# Patient Record
Sex: Male | Born: 1940
Health system: Southern US, Community
[De-identification: ages and names within clinical notes are randomized; demographics above are authoritative.]

## PROBLEM LIST (undated history)

## (undated) DIAGNOSIS — Z8719 Personal history of other diseases of the digestive system: Secondary | ICD-10-CM

## (undated) DIAGNOSIS — K51511 Left sided colitis with rectal bleeding: Secondary | ICD-10-CM

## (undated) DIAGNOSIS — K219 Gastro-esophageal reflux disease without esophagitis: Secondary | ICD-10-CM

## (undated) DIAGNOSIS — E039 Hypothyroidism, unspecified: Secondary | ICD-10-CM

## (undated) DIAGNOSIS — G5600 Carpal tunnel syndrome, unspecified upper limb: Secondary | ICD-10-CM

## (undated) DIAGNOSIS — K284 Chronic or unspecified gastrojejunal ulcer with hemorrhage: Secondary | ICD-10-CM

## (undated) HISTORY — PX: ROTATOR CUFF REPAIR: SHX139

## (undated) HISTORY — DX: Chronic or unspecified gastrojejunal ulcer with hemorrhage: K28.4

## (undated) HISTORY — DX: Gastro-esophageal reflux disease without esophagitis: K21.9

## (undated) HISTORY — PX: FRACTURE SURGERY: SHX138

## (undated) HISTORY — DX: Personal history of other diseases of the digestive system: Z87.19

## (undated) HISTORY — PX: THYROIDECTOMY: SHX17

## (undated) HISTORY — PX: ESOPHAGOGASTRODUODENOSCOPY: SHX1529

## (undated) HISTORY — PX: MOUTH SURGERY: SHX715

## (undated) HISTORY — DX: Left sided colitis with rectal bleeding: K51.511

## (undated) HISTORY — PX: CHOLECYSTECTOMY: SHX55

## (undated) HISTORY — PX: TONSILLECTOMY: SUR1361

## (undated) HISTORY — DX: Hypothyroidism, unspecified: E03.9

## (undated) HISTORY — PX: LOW ANTERIOR BOWEL RESECTION: SUR1240

---

## 1995-07-17 DIAGNOSIS — C73 Malignant neoplasm of thyroid gland: Secondary | ICD-10-CM

## 1995-07-17 HISTORY — DX: Malignant neoplasm of thyroid gland: C73

## 1996-01-26 DIAGNOSIS — C73 Malignant neoplasm of thyroid gland: Secondary | ICD-10-CM | POA: Insufficient documentation

## 1996-04-27 DIAGNOSIS — C73 Malignant neoplasm of thyroid gland: Secondary | ICD-10-CM | POA: Insufficient documentation

## 2003-04-22 ENCOUNTER — Ambulatory Visit (HOSPITAL_BASED_OUTPATIENT_CLINIC_OR_DEPARTMENT_OTHER): Admission: RE | Admit: 2003-04-22 | Discharge: 2003-04-22 | Payer: Self-pay | Admitting: Orthopedic Surgery

## 2003-04-22 ENCOUNTER — Ambulatory Visit (HOSPITAL_COMMUNITY): Admission: RE | Admit: 2003-04-22 | Discharge: 2003-04-22 | Payer: Self-pay | Admitting: Orthopedic Surgery

## 2006-08-30 ENCOUNTER — Ambulatory Visit (HOSPITAL_BASED_OUTPATIENT_CLINIC_OR_DEPARTMENT_OTHER): Admission: RE | Admit: 2006-08-30 | Discharge: 2006-08-30 | Payer: Self-pay | Admitting: Orthopedic Surgery

## 2006-11-28 ENCOUNTER — Ambulatory Visit (HOSPITAL_BASED_OUTPATIENT_CLINIC_OR_DEPARTMENT_OTHER): Admission: RE | Admit: 2006-11-28 | Discharge: 2006-11-28 | Payer: Self-pay | Admitting: Orthopedic Surgery

## 2011-12-25 DIAGNOSIS — L821 Other seborrheic keratosis: Secondary | ICD-10-CM | POA: Diagnosis not present

## 2012-01-23 DIAGNOSIS — H43399 Other vitreous opacities, unspecified eye: Secondary | ICD-10-CM | POA: Diagnosis not present

## 2012-01-28 DIAGNOSIS — Z79899 Other long term (current) drug therapy: Secondary | ICD-10-CM | POA: Diagnosis not present

## 2012-01-28 DIAGNOSIS — C73 Malignant neoplasm of thyroid gland: Secondary | ICD-10-CM | POA: Diagnosis not present

## 2012-03-06 DIAGNOSIS — F329 Major depressive disorder, single episode, unspecified: Secondary | ICD-10-CM | POA: Diagnosis not present

## 2012-03-06 DIAGNOSIS — J301 Allergic rhinitis due to pollen: Secondary | ICD-10-CM | POA: Diagnosis not present

## 2012-03-06 DIAGNOSIS — E785 Hyperlipidemia, unspecified: Secondary | ICD-10-CM | POA: Diagnosis not present

## 2012-03-06 DIAGNOSIS — Z125 Encounter for screening for malignant neoplasm of prostate: Secondary | ICD-10-CM | POA: Diagnosis not present

## 2012-04-21 DIAGNOSIS — R7401 Elevation of levels of liver transaminase levels: Secondary | ICD-10-CM | POA: Diagnosis not present

## 2012-04-21 DIAGNOSIS — R7402 Elevation of levels of lactic acid dehydrogenase (LDH): Secondary | ICD-10-CM | POA: Diagnosis not present

## 2012-04-21 DIAGNOSIS — E785 Hyperlipidemia, unspecified: Secondary | ICD-10-CM | POA: Diagnosis not present

## 2012-04-21 DIAGNOSIS — Z23 Encounter for immunization: Secondary | ICD-10-CM | POA: Diagnosis not present

## 2012-04-21 DIAGNOSIS — Z6828 Body mass index (BMI) 28.0-28.9, adult: Secondary | ICD-10-CM | POA: Diagnosis not present

## 2012-05-02 DIAGNOSIS — R7989 Other specified abnormal findings of blood chemistry: Secondary | ICD-10-CM | POA: Diagnosis not present

## 2012-06-24 DIAGNOSIS — R040 Epistaxis: Secondary | ICD-10-CM | POA: Diagnosis not present

## 2012-08-19 DIAGNOSIS — H43399 Other vitreous opacities, unspecified eye: Secondary | ICD-10-CM | POA: Diagnosis not present

## 2012-09-22 DIAGNOSIS — R1032 Left lower quadrant pain: Secondary | ICD-10-CM | POA: Diagnosis not present

## 2012-09-22 DIAGNOSIS — K922 Gastrointestinal hemorrhage, unspecified: Secondary | ICD-10-CM | POA: Diagnosis not present

## 2012-09-22 DIAGNOSIS — K589 Irritable bowel syndrome without diarrhea: Secondary | ICD-10-CM | POA: Diagnosis not present

## 2012-09-22 DIAGNOSIS — R197 Diarrhea, unspecified: Secondary | ICD-10-CM | POA: Diagnosis not present

## 2012-09-26 DIAGNOSIS — K200 Eosinophilic esophagitis: Secondary | ICD-10-CM | POA: Diagnosis not present

## 2012-09-26 DIAGNOSIS — R1314 Dysphagia, pharyngoesophageal phase: Secondary | ICD-10-CM | POA: Diagnosis not present

## 2012-09-26 DIAGNOSIS — K228 Other specified diseases of esophagus: Secondary | ICD-10-CM | POA: Diagnosis not present

## 2012-09-26 DIAGNOSIS — K921 Melena: Secondary | ICD-10-CM | POA: Diagnosis not present

## 2012-09-26 DIAGNOSIS — K5289 Other specified noninfective gastroenteritis and colitis: Secondary | ICD-10-CM | POA: Diagnosis not present

## 2012-09-26 DIAGNOSIS — K633 Ulcer of intestine: Secondary | ICD-10-CM | POA: Diagnosis not present

## 2012-09-26 DIAGNOSIS — R197 Diarrhea, unspecified: Secondary | ICD-10-CM | POA: Diagnosis not present

## 2012-09-26 DIAGNOSIS — K222 Esophageal obstruction: Secondary | ICD-10-CM | POA: Diagnosis not present

## 2012-09-30 DIAGNOSIS — K5130 Ulcerative (chronic) rectosigmoiditis without complications: Secondary | ICD-10-CM | POA: Diagnosis not present

## 2012-09-30 DIAGNOSIS — K219 Gastro-esophageal reflux disease without esophagitis: Secondary | ICD-10-CM | POA: Diagnosis not present

## 2012-10-01 DIAGNOSIS — R197 Diarrhea, unspecified: Secondary | ICD-10-CM | POA: Diagnosis not present

## 2012-10-14 DIAGNOSIS — K5100 Ulcerative (chronic) pancolitis without complications: Secondary | ICD-10-CM | POA: Diagnosis not present

## 2012-11-20 DIAGNOSIS — K5130 Ulcerative (chronic) rectosigmoiditis without complications: Secondary | ICD-10-CM | POA: Diagnosis not present

## 2012-12-23 DIAGNOSIS — K5100 Ulcerative (chronic) pancolitis without complications: Secondary | ICD-10-CM | POA: Diagnosis not present

## 2012-12-23 DIAGNOSIS — K5190 Ulcerative colitis, unspecified, without complications: Secondary | ICD-10-CM | POA: Diagnosis not present

## 2013-01-26 DIAGNOSIS — C73 Malignant neoplasm of thyroid gland: Secondary | ICD-10-CM | POA: Diagnosis not present

## 2013-01-26 DIAGNOSIS — Z85850 Personal history of malignant neoplasm of thyroid: Secondary | ICD-10-CM | POA: Diagnosis not present

## 2013-01-26 DIAGNOSIS — Z09 Encounter for follow-up examination after completed treatment for conditions other than malignant neoplasm: Secondary | ICD-10-CM | POA: Diagnosis not present

## 2013-01-26 DIAGNOSIS — Z79899 Other long term (current) drug therapy: Secondary | ICD-10-CM | POA: Diagnosis not present

## 2013-01-26 DIAGNOSIS — E039 Hypothyroidism, unspecified: Secondary | ICD-10-CM | POA: Diagnosis not present

## 2013-04-22 DIAGNOSIS — E881 Lipodystrophy, not elsewhere classified: Secondary | ICD-10-CM | POA: Diagnosis not present

## 2013-04-22 DIAGNOSIS — K5289 Other specified noninfective gastroenteritis and colitis: Secondary | ICD-10-CM | POA: Diagnosis not present

## 2013-04-22 DIAGNOSIS — G4700 Insomnia, unspecified: Secondary | ICD-10-CM | POA: Diagnosis not present

## 2013-04-22 DIAGNOSIS — Z0000 Encounter for general adult medical examination without abnormal findings: Secondary | ICD-10-CM | POA: Diagnosis not present

## 2013-04-22 DIAGNOSIS — C73 Malignant neoplasm of thyroid gland: Secondary | ICD-10-CM | POA: Diagnosis not present

## 2013-04-22 DIAGNOSIS — Z125 Encounter for screening for malignant neoplasm of prostate: Secondary | ICD-10-CM | POA: Diagnosis not present

## 2013-04-22 DIAGNOSIS — K5229 Other allergic and dietetic gastroenteritis and colitis: Secondary | ICD-10-CM | POA: Diagnosis not present

## 2013-08-13 DIAGNOSIS — K5130 Ulcerative (chronic) rectosigmoiditis without complications: Secondary | ICD-10-CM | POA: Diagnosis not present

## 2013-08-13 DIAGNOSIS — K219 Gastro-esophageal reflux disease without esophagitis: Secondary | ICD-10-CM | POA: Diagnosis not present

## 2013-08-21 DIAGNOSIS — H43399 Other vitreous opacities, unspecified eye: Secondary | ICD-10-CM | POA: Diagnosis not present

## 2014-02-24 DIAGNOSIS — Z85850 Personal history of malignant neoplasm of thyroid: Secondary | ICD-10-CM | POA: Diagnosis not present

## 2014-02-24 DIAGNOSIS — D449 Neoplasm of uncertain behavior of unspecified endocrine gland: Secondary | ICD-10-CM | POA: Diagnosis not present

## 2014-02-24 DIAGNOSIS — D649 Anemia, unspecified: Secondary | ICD-10-CM | POA: Diagnosis not present

## 2014-03-15 DIAGNOSIS — N63 Unspecified lump in unspecified breast: Secondary | ICD-10-CM | POA: Diagnosis not present

## 2014-03-15 DIAGNOSIS — C73 Malignant neoplasm of thyroid gland: Secondary | ICD-10-CM | POA: Diagnosis not present

## 2014-03-15 DIAGNOSIS — Z6829 Body mass index (BMI) 29.0-29.9, adult: Secondary | ICD-10-CM | POA: Diagnosis not present

## 2014-03-15 DIAGNOSIS — E038 Other specified hypothyroidism: Secondary | ICD-10-CM | POA: Diagnosis not present

## 2014-03-18 DIAGNOSIS — N63 Unspecified lump in unspecified breast: Secondary | ICD-10-CM | POA: Diagnosis not present

## 2014-03-18 DIAGNOSIS — N62 Hypertrophy of breast: Secondary | ICD-10-CM | POA: Diagnosis not present

## 2014-04-23 DIAGNOSIS — Z125 Encounter for screening for malignant neoplasm of prostate: Secondary | ICD-10-CM | POA: Diagnosis not present

## 2014-04-23 DIAGNOSIS — N63 Unspecified lump in breast: Secondary | ICD-10-CM | POA: Diagnosis not present

## 2014-04-23 DIAGNOSIS — E789 Disorder of lipoprotein metabolism, unspecified: Secondary | ICD-10-CM | POA: Diagnosis not present

## 2014-04-23 DIAGNOSIS — Z6829 Body mass index (BMI) 29.0-29.9, adult: Secondary | ICD-10-CM | POA: Diagnosis not present

## 2014-04-23 DIAGNOSIS — Z23 Encounter for immunization: Secondary | ICD-10-CM | POA: Diagnosis not present

## 2014-04-23 DIAGNOSIS — G4700 Insomnia, unspecified: Secondary | ICD-10-CM | POA: Diagnosis not present

## 2014-04-23 DIAGNOSIS — Z0289 Encounter for other administrative examinations: Secondary | ICD-10-CM | POA: Diagnosis not present

## 2014-04-23 DIAGNOSIS — Z0000 Encounter for general adult medical examination without abnormal findings: Secondary | ICD-10-CM | POA: Diagnosis not present

## 2014-04-23 DIAGNOSIS — K529 Noninfective gastroenteritis and colitis, unspecified: Secondary | ICD-10-CM | POA: Diagnosis not present

## 2014-04-23 DIAGNOSIS — E785 Hyperlipidemia, unspecified: Secondary | ICD-10-CM | POA: Diagnosis not present

## 2014-04-23 DIAGNOSIS — J301 Allergic rhinitis due to pollen: Secondary | ICD-10-CM | POA: Diagnosis not present

## 2014-05-05 DIAGNOSIS — Z85850 Personal history of malignant neoplasm of thyroid: Secondary | ICD-10-CM | POA: Diagnosis not present

## 2014-05-05 DIAGNOSIS — E890 Postprocedural hypothyroidism: Secondary | ICD-10-CM | POA: Diagnosis not present

## 2014-06-16 DIAGNOSIS — E890 Postprocedural hypothyroidism: Secondary | ICD-10-CM | POA: Diagnosis not present

## 2014-06-16 DIAGNOSIS — Z85850 Personal history of malignant neoplasm of thyroid: Secondary | ICD-10-CM | POA: Diagnosis not present

## 2014-08-03 DIAGNOSIS — K219 Gastro-esophageal reflux disease without esophagitis: Secondary | ICD-10-CM | POA: Diagnosis not present

## 2014-08-03 DIAGNOSIS — K5130 Ulcerative (chronic) rectosigmoiditis without complications: Secondary | ICD-10-CM | POA: Diagnosis not present

## 2014-08-27 DIAGNOSIS — K625 Hemorrhage of anus and rectum: Secondary | ICD-10-CM | POA: Diagnosis not present

## 2014-08-27 DIAGNOSIS — Z8719 Personal history of other diseases of the digestive system: Secondary | ICD-10-CM | POA: Diagnosis not present

## 2014-08-27 DIAGNOSIS — R197 Diarrhea, unspecified: Secondary | ICD-10-CM | POA: Diagnosis not present

## 2014-08-27 DIAGNOSIS — K5100 Ulcerative (chronic) pancolitis without complications: Secondary | ICD-10-CM | POA: Diagnosis not present

## 2014-08-31 DIAGNOSIS — A09 Infectious gastroenteritis and colitis, unspecified: Secondary | ICD-10-CM | POA: Diagnosis not present

## 2014-09-06 DIAGNOSIS — K529 Noninfective gastroenteritis and colitis, unspecified: Secondary | ICD-10-CM | POA: Diagnosis not present

## 2014-09-06 DIAGNOSIS — Z87891 Personal history of nicotine dependence: Secondary | ICD-10-CM | POA: Diagnosis not present

## 2014-09-06 DIAGNOSIS — R1310 Dysphagia, unspecified: Secondary | ICD-10-CM | POA: Diagnosis not present

## 2014-09-06 DIAGNOSIS — Z980 Intestinal bypass and anastomosis status: Secondary | ICD-10-CM | POA: Diagnosis not present

## 2014-09-06 DIAGNOSIS — Z8719 Personal history of other diseases of the digestive system: Secondary | ICD-10-CM | POA: Diagnosis not present

## 2014-09-06 DIAGNOSIS — K580 Irritable bowel syndrome with diarrhea: Secondary | ICD-10-CM | POA: Diagnosis not present

## 2014-09-06 DIAGNOSIS — E890 Postprocedural hypothyroidism: Secondary | ICD-10-CM | POA: Diagnosis not present

## 2014-09-06 DIAGNOSIS — K625 Hemorrhage of anus and rectum: Secondary | ICD-10-CM | POA: Diagnosis not present

## 2014-09-06 DIAGNOSIS — K5730 Diverticulosis of large intestine without perforation or abscess without bleeding: Secondary | ICD-10-CM | POA: Diagnosis not present

## 2014-09-06 DIAGNOSIS — R197 Diarrhea, unspecified: Secondary | ICD-10-CM | POA: Diagnosis not present

## 2014-09-06 DIAGNOSIS — K5190 Ulcerative colitis, unspecified, without complications: Secondary | ICD-10-CM | POA: Diagnosis not present

## 2014-09-06 DIAGNOSIS — Z85850 Personal history of malignant neoplasm of thyroid: Secondary | ICD-10-CM | POA: Diagnosis not present

## 2014-09-06 DIAGNOSIS — K51511 Left sided colitis with rectal bleeding: Secondary | ICD-10-CM | POA: Diagnosis not present

## 2014-09-06 DIAGNOSIS — K648 Other hemorrhoids: Secondary | ICD-10-CM | POA: Diagnosis not present

## 2014-09-06 HISTORY — PX: COLONOSCOPY: SHX174

## 2014-09-10 DIAGNOSIS — H43813 Vitreous degeneration, bilateral: Secondary | ICD-10-CM | POA: Diagnosis not present

## 2014-10-08 DIAGNOSIS — K51511 Left sided colitis with rectal bleeding: Secondary | ICD-10-CM | POA: Diagnosis not present

## 2014-10-08 DIAGNOSIS — K219 Gastro-esophageal reflux disease without esophagitis: Secondary | ICD-10-CM | POA: Diagnosis not present

## 2014-10-15 DIAGNOSIS — E890 Postprocedural hypothyroidism: Secondary | ICD-10-CM | POA: Diagnosis not present

## 2014-10-15 DIAGNOSIS — Z85850 Personal history of malignant neoplasm of thyroid: Secondary | ICD-10-CM | POA: Diagnosis not present

## 2014-11-15 DIAGNOSIS — Z85850 Personal history of malignant neoplasm of thyroid: Secondary | ICD-10-CM | POA: Diagnosis not present

## 2014-12-20 DIAGNOSIS — K5100 Ulcerative (chronic) pancolitis without complications: Secondary | ICD-10-CM | POA: Diagnosis not present

## 2014-12-20 DIAGNOSIS — J439 Emphysema, unspecified: Secondary | ICD-10-CM | POA: Diagnosis not present

## 2014-12-20 DIAGNOSIS — K625 Hemorrhage of anus and rectum: Secondary | ICD-10-CM | POA: Diagnosis not present

## 2014-12-20 DIAGNOSIS — R1032 Left lower quadrant pain: Secondary | ICD-10-CM | POA: Diagnosis not present

## 2014-12-21 DIAGNOSIS — L820 Inflamed seborrheic keratosis: Secondary | ICD-10-CM | POA: Diagnosis not present

## 2015-01-04 DIAGNOSIS — Z1159 Encounter for screening for other viral diseases: Secondary | ICD-10-CM | POA: Diagnosis not present

## 2015-01-04 DIAGNOSIS — Z111 Encounter for screening for respiratory tuberculosis: Secondary | ICD-10-CM | POA: Diagnosis not present

## 2015-01-25 DIAGNOSIS — Z85850 Personal history of malignant neoplasm of thyroid: Secondary | ICD-10-CM | POA: Diagnosis not present

## 2015-01-28 DIAGNOSIS — K5100 Ulcerative (chronic) pancolitis without complications: Secondary | ICD-10-CM | POA: Diagnosis not present

## 2015-02-02 DIAGNOSIS — Z85850 Personal history of malignant neoplasm of thyroid: Secondary | ICD-10-CM | POA: Diagnosis not present

## 2015-02-02 DIAGNOSIS — Z9089 Acquired absence of other organs: Secondary | ICD-10-CM | POA: Diagnosis not present

## 2015-04-27 DIAGNOSIS — Z23 Encounter for immunization: Secondary | ICD-10-CM | POA: Diagnosis not present

## 2015-06-21 DIAGNOSIS — Z6830 Body mass index (BMI) 30.0-30.9, adult: Secondary | ICD-10-CM | POA: Diagnosis not present

## 2015-06-21 DIAGNOSIS — R030 Elevated blood-pressure reading, without diagnosis of hypertension: Secondary | ICD-10-CM | POA: Diagnosis not present

## 2015-06-21 DIAGNOSIS — J208 Acute bronchitis due to other specified organisms: Secondary | ICD-10-CM | POA: Diagnosis not present

## 2015-06-21 DIAGNOSIS — J301 Allergic rhinitis due to pollen: Secondary | ICD-10-CM | POA: Diagnosis not present

## 2015-06-21 DIAGNOSIS — K529 Noninfective gastroenteritis and colitis, unspecified: Secondary | ICD-10-CM | POA: Diagnosis not present

## 2015-07-28 DIAGNOSIS — K51511 Left sided colitis with rectal bleeding: Secondary | ICD-10-CM | POA: Diagnosis not present

## 2015-09-01 DIAGNOSIS — E663 Overweight: Secondary | ICD-10-CM | POA: Diagnosis not present

## 2015-09-01 DIAGNOSIS — K529 Noninfective gastroenteritis and colitis, unspecified: Secondary | ICD-10-CM | POA: Diagnosis not present

## 2015-09-01 DIAGNOSIS — J301 Allergic rhinitis due to pollen: Secondary | ICD-10-CM | POA: Diagnosis not present

## 2015-09-01 DIAGNOSIS — J019 Acute sinusitis, unspecified: Secondary | ICD-10-CM | POA: Diagnosis not present

## 2015-09-01 DIAGNOSIS — Z6829 Body mass index (BMI) 29.0-29.9, adult: Secondary | ICD-10-CM | POA: Diagnosis not present

## 2015-09-01 DIAGNOSIS — E038 Other specified hypothyroidism: Secondary | ICD-10-CM | POA: Diagnosis not present

## 2015-09-01 DIAGNOSIS — C73 Malignant neoplasm of thyroid gland: Secondary | ICD-10-CM | POA: Diagnosis not present

## 2015-09-01 DIAGNOSIS — I10 Essential (primary) hypertension: Secondary | ICD-10-CM | POA: Diagnosis not present

## 2015-09-01 DIAGNOSIS — E785 Hyperlipidemia, unspecified: Secondary | ICD-10-CM | POA: Diagnosis not present

## 2015-09-01 DIAGNOSIS — B349 Viral infection, unspecified: Secondary | ICD-10-CM | POA: Diagnosis not present

## 2015-09-01 DIAGNOSIS — J9801 Acute bronchospasm: Secondary | ICD-10-CM | POA: Diagnosis not present

## 2015-09-14 DIAGNOSIS — H43813 Vitreous degeneration, bilateral: Secondary | ICD-10-CM | POA: Diagnosis not present

## 2015-09-20 DIAGNOSIS — K529 Noninfective gastroenteritis and colitis, unspecified: Secondary | ICD-10-CM | POA: Diagnosis not present

## 2015-09-20 DIAGNOSIS — E038 Other specified hypothyroidism: Secondary | ICD-10-CM | POA: Diagnosis not present

## 2015-09-20 DIAGNOSIS — I10 Essential (primary) hypertension: Secondary | ICD-10-CM | POA: Diagnosis not present

## 2015-09-20 DIAGNOSIS — C73 Malignant neoplasm of thyroid gland: Secondary | ICD-10-CM | POA: Diagnosis not present

## 2016-01-25 DIAGNOSIS — Z85850 Personal history of malignant neoplasm of thyroid: Secondary | ICD-10-CM | POA: Diagnosis not present

## 2016-01-25 DIAGNOSIS — D649 Anemia, unspecified: Secondary | ICD-10-CM | POA: Diagnosis not present

## 2016-01-25 DIAGNOSIS — E538 Deficiency of other specified B group vitamins: Secondary | ICD-10-CM | POA: Diagnosis not present

## 2016-01-31 DIAGNOSIS — K51511 Left sided colitis with rectal bleeding: Secondary | ICD-10-CM | POA: Diagnosis not present

## 2016-03-27 DIAGNOSIS — Z0000 Encounter for general adult medical examination without abnormal findings: Secondary | ICD-10-CM | POA: Diagnosis not present

## 2016-03-27 DIAGNOSIS — Z125 Encounter for screening for malignant neoplasm of prostate: Secondary | ICD-10-CM | POA: Diagnosis not present

## 2016-03-27 DIAGNOSIS — Z1389 Encounter for screening for other disorder: Secondary | ICD-10-CM | POA: Diagnosis not present

## 2016-03-27 DIAGNOSIS — Z79899 Other long term (current) drug therapy: Secondary | ICD-10-CM | POA: Diagnosis not present

## 2016-03-27 DIAGNOSIS — Z9181 History of falling: Secondary | ICD-10-CM | POA: Diagnosis not present

## 2016-03-27 DIAGNOSIS — E785 Hyperlipidemia, unspecified: Secondary | ICD-10-CM | POA: Diagnosis not present

## 2016-03-27 DIAGNOSIS — R739 Hyperglycemia, unspecified: Secondary | ICD-10-CM | POA: Diagnosis not present

## 2016-03-27 DIAGNOSIS — D649 Anemia, unspecified: Secondary | ICD-10-CM | POA: Diagnosis not present

## 2016-03-27 DIAGNOSIS — E038 Other specified hypothyroidism: Secondary | ICD-10-CM | POA: Diagnosis not present

## 2016-03-27 DIAGNOSIS — I10 Essential (primary) hypertension: Secondary | ICD-10-CM | POA: Diagnosis not present

## 2016-03-27 DIAGNOSIS — K529 Noninfective gastroenteritis and colitis, unspecified: Secondary | ICD-10-CM | POA: Diagnosis not present

## 2016-03-27 DIAGNOSIS — E669 Obesity, unspecified: Secondary | ICD-10-CM | POA: Diagnosis not present

## 2016-04-10 DIAGNOSIS — I10 Essential (primary) hypertension: Secondary | ICD-10-CM | POA: Diagnosis not present

## 2016-04-10 DIAGNOSIS — Z23 Encounter for immunization: Secondary | ICD-10-CM | POA: Diagnosis not present

## 2016-04-10 DIAGNOSIS — Z87448 Personal history of other diseases of urinary system: Secondary | ICD-10-CM | POA: Diagnosis not present

## 2016-04-10 DIAGNOSIS — Z6829 Body mass index (BMI) 29.0-29.9, adult: Secondary | ICD-10-CM | POA: Diagnosis not present

## 2016-05-08 DIAGNOSIS — K51511 Left sided colitis with rectal bleeding: Secondary | ICD-10-CM | POA: Diagnosis not present

## 2016-07-05 DIAGNOSIS — I10 Essential (primary) hypertension: Secondary | ICD-10-CM | POA: Diagnosis not present

## 2016-07-05 DIAGNOSIS — B349 Viral infection, unspecified: Secondary | ICD-10-CM | POA: Diagnosis not present

## 2016-07-05 DIAGNOSIS — J101 Influenza due to other identified influenza virus with other respiratory manifestations: Secondary | ICD-10-CM | POA: Diagnosis not present

## 2016-07-05 DIAGNOSIS — J189 Pneumonia, unspecified organism: Secondary | ICD-10-CM | POA: Diagnosis not present

## 2016-07-19 DIAGNOSIS — Z79899 Other long term (current) drug therapy: Secondary | ICD-10-CM | POA: Diagnosis not present

## 2016-07-19 DIAGNOSIS — Z6828 Body mass index (BMI) 28.0-28.9, adult: Secondary | ICD-10-CM | POA: Diagnosis not present

## 2016-07-19 DIAGNOSIS — D649 Anemia, unspecified: Secondary | ICD-10-CM | POA: Diagnosis not present

## 2016-07-19 DIAGNOSIS — I10 Essential (primary) hypertension: Secondary | ICD-10-CM | POA: Diagnosis not present

## 2016-07-19 DIAGNOSIS — E063 Autoimmune thyroiditis: Secondary | ICD-10-CM | POA: Diagnosis not present

## 2016-07-19 DIAGNOSIS — J9801 Acute bronchospasm: Secondary | ICD-10-CM | POA: Diagnosis not present

## 2016-07-19 DIAGNOSIS — E785 Hyperlipidemia, unspecified: Secondary | ICD-10-CM | POA: Diagnosis not present

## 2016-07-19 DIAGNOSIS — K529 Noninfective gastroenteritis and colitis, unspecified: Secondary | ICD-10-CM | POA: Diagnosis not present

## 2016-07-19 DIAGNOSIS — J301 Allergic rhinitis due to pollen: Secondary | ICD-10-CM | POA: Diagnosis not present

## 2016-07-19 DIAGNOSIS — R739 Hyperglycemia, unspecified: Secondary | ICD-10-CM | POA: Diagnosis not present

## 2016-08-07 DIAGNOSIS — Z8719 Personal history of other diseases of the digestive system: Secondary | ICD-10-CM | POA: Diagnosis not present

## 2016-08-07 DIAGNOSIS — K51511 Left sided colitis with rectal bleeding: Secondary | ICD-10-CM | POA: Diagnosis not present

## 2016-08-17 DIAGNOSIS — R1084 Generalized abdominal pain: Secondary | ICD-10-CM | POA: Diagnosis not present

## 2016-08-17 DIAGNOSIS — K2970 Gastritis, unspecified, without bleeding: Secondary | ICD-10-CM | POA: Diagnosis not present

## 2016-08-17 DIAGNOSIS — R1030 Lower abdominal pain, unspecified: Secondary | ICD-10-CM | POA: Diagnosis not present

## 2016-08-17 DIAGNOSIS — I10 Essential (primary) hypertension: Secondary | ICD-10-CM | POA: Diagnosis not present

## 2016-08-17 DIAGNOSIS — R10819 Abdominal tenderness, unspecified site: Secondary | ICD-10-CM | POA: Diagnosis not present

## 2016-08-17 DIAGNOSIS — I723 Aneurysm of iliac artery: Secondary | ICD-10-CM | POA: Diagnosis not present

## 2016-08-17 DIAGNOSIS — R079 Chest pain, unspecified: Secondary | ICD-10-CM | POA: Diagnosis not present

## 2016-08-17 DIAGNOSIS — R109 Unspecified abdominal pain: Secondary | ICD-10-CM | POA: Diagnosis not present

## 2016-08-23 DIAGNOSIS — R1011 Right upper quadrant pain: Secondary | ICD-10-CM | POA: Diagnosis not present

## 2016-08-23 DIAGNOSIS — K8010 Calculus of gallbladder with chronic cholecystitis without obstruction: Secondary | ICD-10-CM | POA: Diagnosis not present

## 2016-08-23 DIAGNOSIS — R1013 Epigastric pain: Secondary | ICD-10-CM | POA: Diagnosis not present

## 2016-08-23 DIAGNOSIS — K219 Gastro-esophageal reflux disease without esophagitis: Secondary | ICD-10-CM | POA: Diagnosis not present

## 2016-08-27 DIAGNOSIS — Z01818 Encounter for other preprocedural examination: Secondary | ICD-10-CM | POA: Diagnosis not present

## 2016-08-27 DIAGNOSIS — E039 Hypothyroidism, unspecified: Secondary | ICD-10-CM | POA: Diagnosis not present

## 2016-08-30 DIAGNOSIS — K8010 Calculus of gallbladder with chronic cholecystitis without obstruction: Secondary | ICD-10-CM | POA: Diagnosis not present

## 2016-08-30 DIAGNOSIS — Z79899 Other long term (current) drug therapy: Secondary | ICD-10-CM | POA: Diagnosis not present

## 2016-08-30 DIAGNOSIS — E039 Hypothyroidism, unspecified: Secondary | ICD-10-CM | POA: Diagnosis not present

## 2016-08-30 DIAGNOSIS — I723 Aneurysm of iliac artery: Secondary | ICD-10-CM | POA: Diagnosis not present

## 2016-08-30 DIAGNOSIS — K219 Gastro-esophageal reflux disease without esophagitis: Secondary | ICD-10-CM | POA: Diagnosis not present

## 2016-08-30 DIAGNOSIS — K5190 Ulcerative colitis, unspecified, without complications: Secondary | ICD-10-CM | POA: Diagnosis not present

## 2016-08-30 DIAGNOSIS — K8020 Calculus of gallbladder without cholecystitis without obstruction: Secondary | ICD-10-CM | POA: Diagnosis not present

## 2016-08-30 DIAGNOSIS — I7090 Unspecified atherosclerosis: Secondary | ICD-10-CM | POA: Diagnosis not present

## 2016-08-30 DIAGNOSIS — I10 Essential (primary) hypertension: Secondary | ICD-10-CM | POA: Diagnosis not present

## 2016-08-31 DIAGNOSIS — K8010 Calculus of gallbladder with chronic cholecystitis without obstruction: Secondary | ICD-10-CM | POA: Diagnosis not present

## 2016-09-12 DIAGNOSIS — H43813 Vitreous degeneration, bilateral: Secondary | ICD-10-CM | POA: Diagnosis not present

## 2017-01-24 DIAGNOSIS — E890 Postprocedural hypothyroidism: Secondary | ICD-10-CM | POA: Diagnosis not present

## 2017-01-24 DIAGNOSIS — D649 Anemia, unspecified: Secondary | ICD-10-CM | POA: Diagnosis not present

## 2017-01-24 DIAGNOSIS — Z85850 Personal history of malignant neoplasm of thyroid: Secondary | ICD-10-CM | POA: Diagnosis not present

## 2017-03-29 DIAGNOSIS — C73 Malignant neoplasm of thyroid gland: Secondary | ICD-10-CM | POA: Diagnosis not present

## 2017-03-29 DIAGNOSIS — E663 Overweight: Secondary | ICD-10-CM | POA: Diagnosis not present

## 2017-03-29 DIAGNOSIS — Z125 Encounter for screening for malignant neoplasm of prostate: Secondary | ICD-10-CM | POA: Diagnosis not present

## 2017-03-29 DIAGNOSIS — Z0000 Encounter for general adult medical examination without abnormal findings: Secondary | ICD-10-CM | POA: Diagnosis not present

## 2017-03-29 DIAGNOSIS — K529 Noninfective gastroenteritis and colitis, unspecified: Secondary | ICD-10-CM | POA: Diagnosis not present

## 2017-03-29 DIAGNOSIS — E063 Autoimmune thyroiditis: Secondary | ICD-10-CM | POA: Diagnosis not present

## 2017-03-29 DIAGNOSIS — R739 Hyperglycemia, unspecified: Secondary | ICD-10-CM | POA: Diagnosis not present

## 2017-03-29 DIAGNOSIS — E785 Hyperlipidemia, unspecified: Secondary | ICD-10-CM | POA: Diagnosis not present

## 2017-03-29 DIAGNOSIS — Z6828 Body mass index (BMI) 28.0-28.9, adult: Secondary | ICD-10-CM | POA: Diagnosis not present

## 2017-03-29 DIAGNOSIS — Z79899 Other long term (current) drug therapy: Secondary | ICD-10-CM | POA: Diagnosis not present

## 2017-03-29 DIAGNOSIS — M818 Other osteoporosis without current pathological fracture: Secondary | ICD-10-CM | POA: Diagnosis not present

## 2017-03-29 DIAGNOSIS — I10 Essential (primary) hypertension: Secondary | ICD-10-CM | POA: Diagnosis not present

## 2017-03-29 DIAGNOSIS — D649 Anemia, unspecified: Secondary | ICD-10-CM | POA: Diagnosis not present

## 2017-04-02 DIAGNOSIS — Z87448 Personal history of other diseases of urinary system: Secondary | ICD-10-CM | POA: Diagnosis not present

## 2017-04-29 DIAGNOSIS — Z23 Encounter for immunization: Secondary | ICD-10-CM | POA: Diagnosis not present

## 2017-07-11 DIAGNOSIS — J9801 Acute bronchospasm: Secondary | ICD-10-CM | POA: Diagnosis not present

## 2017-07-11 DIAGNOSIS — Z6829 Body mass index (BMI) 29.0-29.9, adult: Secondary | ICD-10-CM | POA: Diagnosis not present

## 2017-07-11 DIAGNOSIS — C73 Malignant neoplasm of thyroid gland: Secondary | ICD-10-CM | POA: Diagnosis not present

## 2017-07-11 DIAGNOSIS — K529 Noninfective gastroenteritis and colitis, unspecified: Secondary | ICD-10-CM | POA: Diagnosis not present

## 2017-07-11 DIAGNOSIS — I10 Essential (primary) hypertension: Secondary | ICD-10-CM | POA: Diagnosis not present

## 2017-07-11 DIAGNOSIS — J019 Acute sinusitis, unspecified: Secondary | ICD-10-CM | POA: Diagnosis not present

## 2017-07-26 DIAGNOSIS — D649 Anemia, unspecified: Secondary | ICD-10-CM | POA: Diagnosis not present

## 2017-09-16 DIAGNOSIS — H43813 Vitreous degeneration, bilateral: Secondary | ICD-10-CM | POA: Diagnosis not present

## 2017-10-07 ENCOUNTER — Encounter: Payer: Self-pay | Admitting: Gastroenterology

## 2018-01-06 DIAGNOSIS — M1712 Unilateral primary osteoarthritis, left knee: Secondary | ICD-10-CM | POA: Diagnosis not present

## 2018-01-27 DIAGNOSIS — E890 Postprocedural hypothyroidism: Secondary | ICD-10-CM | POA: Diagnosis not present

## 2018-01-27 DIAGNOSIS — D649 Anemia, unspecified: Secondary | ICD-10-CM | POA: Diagnosis not present

## 2018-01-27 DIAGNOSIS — R5383 Other fatigue: Secondary | ICD-10-CM | POA: Diagnosis not present

## 2018-01-27 DIAGNOSIS — Z85850 Personal history of malignant neoplasm of thyroid: Secondary | ICD-10-CM | POA: Diagnosis not present

## 2018-01-28 DIAGNOSIS — R109 Unspecified abdominal pain: Secondary | ICD-10-CM | POA: Diagnosis not present

## 2018-01-28 DIAGNOSIS — K219 Gastro-esophageal reflux disease without esophagitis: Secondary | ICD-10-CM | POA: Diagnosis not present

## 2018-01-28 DIAGNOSIS — E860 Dehydration: Secondary | ICD-10-CM | POA: Diagnosis not present

## 2018-01-28 DIAGNOSIS — K5900 Constipation, unspecified: Secondary | ICD-10-CM | POA: Diagnosis not present

## 2018-01-28 DIAGNOSIS — I10 Essential (primary) hypertension: Secondary | ICD-10-CM | POA: Diagnosis not present

## 2018-01-28 DIAGNOSIS — Z87891 Personal history of nicotine dependence: Secondary | ICD-10-CM | POA: Diagnosis not present

## 2018-02-12 DIAGNOSIS — I10 Essential (primary) hypertension: Secondary | ICD-10-CM | POA: Diagnosis not present

## 2018-02-12 DIAGNOSIS — Z87448 Personal history of other diseases of urinary system: Secondary | ICD-10-CM | POA: Diagnosis not present

## 2018-02-12 DIAGNOSIS — E663 Overweight: Secondary | ICD-10-CM | POA: Diagnosis not present

## 2018-02-12 DIAGNOSIS — Z9181 History of falling: Secondary | ICD-10-CM | POA: Diagnosis not present

## 2018-02-12 DIAGNOSIS — Z79899 Other long term (current) drug therapy: Secondary | ICD-10-CM | POA: Diagnosis not present

## 2018-02-12 DIAGNOSIS — D649 Anemia, unspecified: Secondary | ICD-10-CM | POA: Diagnosis not present

## 2018-02-12 DIAGNOSIS — Z0100 Encounter for examination of eyes and vision without abnormal findings: Secondary | ICD-10-CM | POA: Diagnosis not present

## 2018-02-13 ENCOUNTER — Encounter: Payer: Self-pay | Admitting: Gastroenterology

## 2018-02-18 DIAGNOSIS — N281 Cyst of kidney, acquired: Secondary | ICD-10-CM | POA: Diagnosis not present

## 2018-02-18 DIAGNOSIS — I714 Abdominal aortic aneurysm, without rupture: Secondary | ICD-10-CM | POA: Diagnosis not present

## 2018-02-18 DIAGNOSIS — I723 Aneurysm of iliac artery: Secondary | ICD-10-CM | POA: Diagnosis not present

## 2018-04-14 ENCOUNTER — Encounter: Payer: Medicare Other | Admitting: Gastroenterology

## 2018-05-09 DIAGNOSIS — Z23 Encounter for immunization: Secondary | ICD-10-CM | POA: Diagnosis not present

## 2018-06-19 ENCOUNTER — Telehealth: Payer: Self-pay | Admitting: Gastroenterology

## 2018-06-19 NOTE — Telephone Encounter (Signed)
Would you like to give refills, if so how many?

## 2018-06-27 ENCOUNTER — Encounter: Payer: Self-pay | Admitting: Gastroenterology

## 2018-06-30 ENCOUNTER — Encounter: Payer: Self-pay | Admitting: Gastroenterology

## 2018-06-30 ENCOUNTER — Ambulatory Visit (INDEPENDENT_AMBULATORY_CARE_PROVIDER_SITE_OTHER): Payer: Medicare Other | Admitting: Gastroenterology

## 2018-06-30 VITALS — BP 140/74 | HR 62 | Ht 69.25 in | Wt 201.4 lb

## 2018-06-30 DIAGNOSIS — Z1211 Encounter for screening for malignant neoplasm of colon: Secondary | ICD-10-CM

## 2018-06-30 DIAGNOSIS — K51919 Ulcerative colitis, unspecified with unspecified complications: Secondary | ICD-10-CM

## 2018-06-30 MED ORDER — SULFASALAZINE 500 MG PO TABS: 1000.0000 mg | ORAL_TABLET | Freq: Two times a day (BID) | ORAL | 11 refills | Status: DC

## 2018-06-30 NOTE — Progress Notes (Signed)
Chief Complaint: FU  Referring Provider: Dr Micheal Likens      ASSESSMENT AND PLAN;   #1.  Left-sided ulcerative colitis Dx 2011.  #2.  History of diverticulitis status post LAR 1/98  Plan: - Sulfasalazine 500 mg/tab. 2 tablets p.o. twice daily #120, 11 refills.  Discussed S/Es including photosensitivity. - Folic 92m po qd. - Encouraged him to get routine annual physical exam (and blood work) and bone density scan (Dr GMicheal Likens - Recommend screening colonoscopy with MiraLAX 07/2018  HPI:    Devin POTTINGERis a 77y.o. male  For follow-up visit To get his medications refilled. Doing very well No nausea, vomiting, heartburn, regurgitation, odynophagia or dysphagia.  No significant diarrhea or constipation.  There is no melena or hematochezia. No unintentional weight loss.  Previous GI procedures: -Colonoscopy 08/2014: Left-sided ulcerative colitis up to 55 cm, normal remaining colon, normal TI.  Mild pancolonic diverticulosis, status post sigmoid resection with patent anastomosis. Additional social history: Married to Devin [name] self-employed, owns [name] Daughter LCordelia Penand son TKonrad DoloresPast Medical History:  Diagnosis Date  . GERD (gastroesophageal reflux disease)   . History of diverticulitis   . Hypothyroidism   . Left sided ulcerative colitis with rectal bleeding ([facility]     Past Surgical History:  Procedure Laterality Date  . COLONOSCOPY  09/06/2014   Left sided ulcerative colitis up to 55cm. Normal remaining colon. Normal TI. Mild pancolonic diverticulosis. Status post sigmoid resection with patent colo-colic anastomosis.   .Marland KitchenESOPHAGOGASTRODUODENOSCOPY     Dr BMelina Copa Years ago per patient   . LOW ANTERIOR BOWEL RESECTION    . MOUTH SURGERY    . ROTATOR CUFF REPAIR    . THYROIDECTOMY      Family History  Problem Relation Age of Onset  . Colon cancer Neg Hx   . Esophageal cancer Neg Hx     Social History   Tobacco Use  . Smoking status: Never Smoker    . Smokeless tobacco: Never Used  . Tobacco comment: said he smoked apack in his life as a  teen  Substance Use Topics  . Alcohol use: Not Currently  . Drug use: Never    Current Outpatient Medications  Medication Sig Dispense Refill  . levothyroxine (SYNTHROID) 137 MCG tablet Take by mouth daily.    .Marland Kitchenlosartan (COZAAR) 50 MG tablet Take 50 mg by mouth daily.  12  . sulfaSALAzine (AZULFIDINE) 500 MG tablet Take 500 mg by mouth 3 (three) times daily.   6   No current facility-administered medications for this visit.     Not on File  Review of Systems:  Constitutional: Denies fever, chills, diaphoresis, appetite change and fatigue.  HEENT: Denies photophobia, eye pain, redness, hearing loss, ear pain, congestion, sore throat, rhinorrhea, sneezing, mouth sores, neck pain, neck stiffness and tinnitus.   Respiratory: Denies SOB, DOE, cough, chest tightness,  and wheezing.   Cardiovascular: Denies chest pain, palpitations and leg swelling.  Genitourinary: Denies dysuria, urgency, frequency, hematuria, flank pain and difficulty urinating.  Musculoskeletal: Denies myalgias, back pain, joint swelling, arthralgias and gait problem.  Skin: No rash.  Neurological: Denies dizziness, seizures, syncope, weakness, light-headedness, numbness and headaches.  Hematological: Denies adenopathy. Easy bruising, personal or family bleeding history  Psychiatric/Behavioral: No anxiety or depression     Physical Exam:    BP 140/74   Pulse 62   Ht 5' 9.25" (1.759 m)   Wt 201 lb 6 oz (91.3 kg)   BMI 29.52  kg/m  Filed Weights   06/30/18 1011  Weight: 201 lb 6 oz (91.3 kg)   Constitutional:  Well-developed, in no acute distress. Psychiatric: Normal mood and affect. Behavior is normal. HEENT: Pupils normal.  Conjunctivae are normal. No scleral icterus. Neck supple.  Cardiovascular: Normal rate, regular rhythm. No edema Pulmonary/chest: Effort normal and breath sounds normal. No wheezing, rales or  rhonchi. Abdominal: Soft, nondistended. Nontender. Bowel sounds active throughout. There are no masses palpable. No hepatomegaly. Rectal:  defered Neurological: Alert and oriented to person place and time. Skin: Skin is warm and dry. No rashes noted. 25 minutes spent with the patient today. Greater than 50% was spent in counseling and coordination of care with the patient    Carmell Austria, MD 06/30/2018, 10:27 AM  Cc: Dr. Micheal Likens

## 2018-06-30 NOTE — Patient Instructions (Signed)
If you are age 77 or older, your body mass index should be between 23-30. Your Body mass index is 29.52 kg/m. If this is out of the aforementioned range listed, please consider follow up with your Primary Care Provider.  If you are age 52 or younger, your body mass index should be between 19-25. Your Body mass index is 29.52 kg/m. If this is out of the aformentioned range listed, please consider follow up with your Primary Care Provider.   We have sent the following medications to your pharmacy for you to pick up at your convenience: Sulfasalizine two tablets by mouth twice daily.   Have Dr. Micheal Likens order you a Bone Density Scan.   You have been scheduled for a colonoscopy. Please follow written instructions given to you at your visit today.  Please pick up your prep supplies at the pharmacy within the next 1-3 days. If you use inhalers (even only as needed), please bring them with you on the day of your procedure. Your physician has requested that you go to www.startemmi.com and enter the access code given to you at your visit today. This web site gives a general overview about your procedure. However, you should still follow specific instructions given to you by our office regarding your preparation for the procedure.  Thank you,  Dr. Jackquline Denmark

## 2018-07-01 ENCOUNTER — Telehealth: Payer: Self-pay | Admitting: Gastroenterology

## 2018-07-01 DIAGNOSIS — R739 Hyperglycemia, unspecified: Secondary | ICD-10-CM | POA: Diagnosis not present

## 2018-07-01 DIAGNOSIS — E785 Hyperlipidemia, unspecified: Secondary | ICD-10-CM | POA: Diagnosis not present

## 2018-07-01 DIAGNOSIS — D649 Anemia, unspecified: Secondary | ICD-10-CM | POA: Diagnosis not present

## 2018-07-01 DIAGNOSIS — K529 Noninfective gastroenteritis and colitis, unspecified: Secondary | ICD-10-CM | POA: Diagnosis not present

## 2018-07-01 DIAGNOSIS — Z125 Encounter for screening for malignant neoplasm of prostate: Secondary | ICD-10-CM | POA: Diagnosis not present

## 2018-07-01 DIAGNOSIS — I10 Essential (primary) hypertension: Secondary | ICD-10-CM | POA: Diagnosis not present

## 2018-07-01 DIAGNOSIS — E063 Autoimmune thyroiditis: Secondary | ICD-10-CM | POA: Diagnosis not present

## 2018-07-02 NOTE — Telephone Encounter (Signed)
Please route OV to Dr. Micheal Likens I believe he should get it automatically since note does say cc Dr Micheal Likens

## 2018-07-02 NOTE — Telephone Encounter (Signed)
Would you like for the OV note to be routed to Dr. Micheal Likens or would you provide a diagnosis and reason for scan to be completed; please advise

## 2018-07-03 NOTE — Telephone Encounter (Signed)
OV note and advice only message routed to Dr. Micheal Likens with request for them to call Dr. Steve Rattler nurse to discuss plan of care for this patient

## 2018-07-04 NOTE — Telephone Encounter (Signed)
Called and spoke with Renee at Dr. Coolidge Breeze received the paperwork pertaining to the recommendation from Dr. Lyndel Safe concerning the bone density scan-she will request Dr. Micheal Likens to review this paperwork; Dr. Viviann Spare office will then contact the patient with next step in plan of care;   Called and spoke with patient-informed patient that Dr. Viviann Spare office will be in contact with him concerning the bone density test Dr. Lyndel Safe recommended; patient verbalized understanding of the information/instructions; patient advised to call back if questions/concerns arise;

## 2018-07-30 DIAGNOSIS — Z85850 Personal history of malignant neoplasm of thyroid: Secondary | ICD-10-CM | POA: Diagnosis not present

## 2018-07-30 DIAGNOSIS — D649 Anemia, unspecified: Secondary | ICD-10-CM | POA: Diagnosis not present

## 2018-08-05 ENCOUNTER — Encounter: Payer: Self-pay | Admitting: Gastroenterology

## 2018-08-05 ENCOUNTER — Ambulatory Visit (AMBULATORY_SURGERY_CENTER): Payer: Medicare Other | Admitting: Gastroenterology

## 2018-08-05 VITALS — BP 153/90 | HR 59 | Temp 97.3°F | Resp 22 | Ht 69.0 in | Wt 201.0 lb

## 2018-08-05 DIAGNOSIS — K5190 Ulcerative colitis, unspecified, without complications: Secondary | ICD-10-CM

## 2018-08-05 DIAGNOSIS — Z1211 Encounter for screening for malignant neoplasm of colon: Secondary | ICD-10-CM | POA: Diagnosis not present

## 2018-08-05 DIAGNOSIS — D128 Benign neoplasm of rectum: Secondary | ICD-10-CM

## 2018-08-05 MED ORDER — SODIUM CHLORIDE 0.9 % IV SOLN: 500.0000 mL | Freq: Once | INTRAVENOUS | Status: DC

## 2018-08-05 NOTE — Op Note (Signed)
Ages Patient Name: Devin [name] Procedure Date: 08/05/2018 2:01 PM MRN: [id number] Endoscopist: Jackquline Denmark , MD Age: 78 Referring MD:  Date of Birth: 23-Aug-1940 Gender: Male Account #: [id number] Procedure:                Colonoscopy Indications:              High risk colon cancer surveillance.Left-sided                            ulcerative colitis1 Left-sided ulcerative colitis                            Dx 2011. History of diverticulitis status post LAR                            1/98 Medicines:                Monitored Anesthesia Care Procedure:                Pre-Anesthesia Assessment:                           - Prior to the procedure, a History and Physical                            was performed, and patient medications and                            allergies were reviewed. The patient's tolerance of                            previous anesthesia was also reviewed. The risks                            and benefits of the procedure and the sedation                            options and risks were discussed with the patient.                            All questions were answered, and informed consent                            was obtained. Prior Anticoagulants: The patient has                            taken no previous anticoagulant or antiplatelet                            agents. ASA Grade Assessment: II - A patient with                            mild systemic disease. After reviewing the risks  and benefits, the patient was deemed in                            satisfactory condition to undergo the procedure.                           After obtaining informed consent, the colonoscope                            was passed under direct vision. Throughout the                            procedure, the patient's blood pressure, pulse, and                            oxygen saturations were monitored continuously. The                      Model CF-HQ190L [id number]) scope was introduced                            through the anus and advanced to the 2 cm into the                            ileum. The colonoscopy was performed without                            difficulty. The patient tolerated the procedure                            well. The quality of the bowel preparation was                            excellent. Scope In: 2:11:15 PM Scope Out: 2:24:49 PM Scope Withdrawal Time: 0 hours 10 minutes 27 seconds  Total Procedure Duration: 0 hours 13 minutes 34 seconds  Findings:                 The colon (entire examined portion) appeared                            normal. Minimally smudged vascular pattern in the                            rectum, neo-sigmoid colon and splenic flexure up to                            55 cm. Biopsies were taken with a cold forceps for                            histology from the right colon and from the left                            colon. Estimated blood loss: none.  A 6 mm polyp was found in the rectum. The polyp was                            sessile. The polyp was removed with a cold snare.                            Resection and retrieval were complete. Estimated                            blood loss: none.                           Non-bleeding internal hemorrhoids were found. The                            hemorrhoids were small.                           Multiple small-mouthed diverticula were found in                            the descending colon, transverse colon and mainly                            in the ascending colon. No endoscopic evidence of                            diverticulitis.                           Colic anastomosis at 15 cm from the anal verge                            consistent with previous history of sigmoid                            resection.                           The terminal ileum appeared  normal. Complications:            No immediate complications. Estimated Blood Loss:     Estimated blood loss: none. Impression:               -Rectal polyp status post polypectomy.                           -Pancolonic diverticulosis predominantly in the                            right colon.                           -Minimally smudged vascular pattern in the left                            colon up to 55  cm (biopsied to r/o dysplasia). No                            endoscopic evidence of active ulcerative colitis.                           -Status post sigmoid resection.                           -Small internal and external hemorrhoids. Recommendation:           - Patient has a contact number available for                            emergencies. The signs and symptoms of potential                            delayed complications were discussed with the                            patient. Return to normal activities tomorrow.                            Written discharge instructions were provided to the                            patient.                           - Resume previous diet.                           - Continue sulfasalazine and folic acid as before.                           - Await pathology results.                           - Repeat colonoscopy for surveillance based on                            pathology results.                           - Proceed with bone density scan to rule out                            osteoporosis.                           - Return to GI clinic in 6 months. Earlier in case                            of any problems. Jackquline Denmark, MD 08/05/2018 2:36:02 PM This report has been signed electronically.

## 2018-08-05 NOTE — Progress Notes (Signed)
Called to room to assist during endoscopic procedure.  Patient ID and intended procedure confirmed with present staff. Received instructions for my participation in the procedure from the performing physician.  

## 2018-08-05 NOTE — Patient Instructions (Signed)
Random biopsies and one polyp removed. Handouts given on polyps and hemorrhoids and diverticulosis.   YOU HAD AN ENDOSCOPIC PROCEDURE TODAY AT [facility]:   Refer to the procedure report that was given to you for any specific questions about what was found during the examination.  If the procedure report does not answer your questions, please call your gastroenterologist to clarify.  If you requested that your care partner not be given the details of your procedure findings, then the procedure report has been included in a sealed envelope for you to review at your convenience later.  YOU SHOULD EXPECT: Some feelings of bloating in the abdomen. Passage of more gas than usual.  Walking can help get rid of the air that was put into your GI tract during the procedure and reduce the bloating. If you had a lower endoscopy (such as a colonoscopy or flexible sigmoidoscopy) you may notice spotting of blood in your stool or on the toilet paper. If you underwent a bowel prep for your procedure, you may not have a normal bowel movement for a few days.  Please Note:  You might notice some irritation and congestion in your nose or some drainage.  This is from the oxygen used during your procedure.  There is no need for concern and it should clear up in a day or so.  SYMPTOMS TO REPORT IMMEDIATELY:   Following lower endoscopy (colonoscopy or flexible sigmoidoscopy):  Excessive amounts of blood in the stool  Significant tenderness or worsening of abdominal pains  Swelling of the abdomen that is new, acute  Fever of 100F or higher   For urgent or emergent issues, a gastroenterologist can be reached at any hour by calling [phone].   DIET:  We do recommend a small meal at first, but then you may proceed to your regular diet.  Drink plenty of fluids but you should avoid alcoholic beverages for 24 hours.  ACTIVITY:  You should plan to take it easy for the rest of today and you should  NOT DRIVE or use heavy machinery until tomorrow (because of the sedation medicines used during the test).    FOLLOW UP: Our staff will call the number listed on your records the next business day following your procedure to check on you and address any questions or concerns that you may have regarding the information given to you following your procedure. If we do not reach you, we will leave a message.  However, if you are feeling well and you are not experiencing any problems, there is no need to return our call.  We will assume that you have returned to your regular daily activities without incident.  If any biopsies were taken you will be contacted by phone or by letter within the next 1-3 weeks.  Please call us at [phone] if you have not heard about the biopsies in 3 weeks.    SIGNATURES/CONFIDENTIALITY: You and/or your care partner have signed paperwork which will be entered into your electronic medical record.  These signatures attest to the fact that that the information above on your After Visit Summary has been reviewed and is understood.  Full responsibility of the confidentiality of this discharge information lies with you and/or your care-partner.

## 2018-08-05 NOTE — Progress Notes (Signed)
A and O x3. Report to RN. Tolerated MAC anesthesia well.

## 2018-08-06 ENCOUNTER — Telehealth: Payer: Self-pay

## 2018-08-06 ENCOUNTER — Other Ambulatory Visit: Payer: Self-pay

## 2018-08-06 DIAGNOSIS — M818 Other osteoporosis without current pathological fracture: Secondary | ICD-10-CM

## 2018-08-06 NOTE — Telephone Encounter (Signed)
Called and spoke with patient's wife-Phyllis-Phyllis was informed of result note (colonoscopy procedure note) and MD recommendations; [name] was agreeable with plan of care and reports Dr. Micheal Likens did not deem the bone density scan needed and did not place the order for scan to be done at [facility]; [name] was informed of bone density scan being scheduled at [facility] location on 08/08/2018 appt at 10:00 am; [name] advised for patient to hold all medications containing calcium (verbalized patient is taking a multivitmain but will not take the morning of scan)-she verbalized understanding of information/instructions;  Patient was advised to call the office at [phone] if questions/concerns arise;

## 2018-08-06 NOTE — Telephone Encounter (Signed)
  Follow up Call-  Call back number 08/05/2018  Post procedure Call Back phone  # [phone]  Permission to leave phone message Yes  Some recent data might be hidden     Patient questions:  Do you have a fever, pain , or abdominal swelling? No. Pain Score  0 *  Have you tolerated food without any problems? Yes.    Have you been able to return to your normal activities? Yes.    Do you have any questions about your discharge instructions: Diet   No. Medications  No. Follow up visit  No.  Do you have questions or concerns about your Care? No.  Actions: * If pain score is 4 or above: No action needed, pain <4.

## 2018-08-08 ENCOUNTER — Ambulatory Visit (HOSPITAL_BASED_OUTPATIENT_CLINIC_OR_DEPARTMENT_OTHER)
Admission: RE | Admit: 2018-08-08 | Discharge: 2018-08-08 | Disposition: A | Discharge status: Home / Self Care | Payer: Medicare Other | Source: Ambulatory Visit | Attending: Gastroenterology | Admitting: Gastroenterology

## 2018-08-08 DIAGNOSIS — M818 Other osteoporosis without current pathological fracture: Secondary | ICD-10-CM

## 2018-08-08 DIAGNOSIS — Z13820 Encounter for screening for osteoporosis: Secondary | ICD-10-CM | POA: Diagnosis not present

## 2018-08-12 ENCOUNTER — Encounter: Payer: Self-pay | Admitting: Gastroenterology

## 2018-10-09 DIAGNOSIS — J019 Acute sinusitis, unspecified: Secondary | ICD-10-CM | POA: Diagnosis not present

## 2018-10-09 DIAGNOSIS — I10 Essential (primary) hypertension: Secondary | ICD-10-CM | POA: Diagnosis not present

## 2018-10-09 DIAGNOSIS — J9801 Acute bronchospasm: Secondary | ICD-10-CM | POA: Diagnosis not present

## 2018-10-09 DIAGNOSIS — Z6829 Body mass index (BMI) 29.0-29.9, adult: Secondary | ICD-10-CM | POA: Diagnosis not present

## 2018-10-23 DIAGNOSIS — I10 Essential (primary) hypertension: Secondary | ICD-10-CM | POA: Diagnosis not present

## 2018-10-23 DIAGNOSIS — K529 Noninfective gastroenteritis and colitis, unspecified: Secondary | ICD-10-CM | POA: Diagnosis not present

## 2018-10-23 DIAGNOSIS — Z85850 Personal history of malignant neoplasm of thyroid: Secondary | ICD-10-CM | POA: Diagnosis not present

## 2018-10-23 DIAGNOSIS — J9801 Acute bronchospasm: Secondary | ICD-10-CM | POA: Diagnosis not present

## 2018-11-24 DIAGNOSIS — H43813 Vitreous degeneration, bilateral: Secondary | ICD-10-CM | POA: Diagnosis not present

## 2019-01-28 DIAGNOSIS — D649 Anemia, unspecified: Secondary | ICD-10-CM | POA: Diagnosis not present

## 2019-01-28 DIAGNOSIS — E890 Postprocedural hypothyroidism: Secondary | ICD-10-CM | POA: Diagnosis not present

## 2019-01-28 DIAGNOSIS — Z85850 Personal history of malignant neoplasm of thyroid: Secondary | ICD-10-CM | POA: Diagnosis not present

## 2019-02-10 DIAGNOSIS — R739 Hyperglycemia, unspecified: Secondary | ICD-10-CM | POA: Diagnosis not present

## 2019-02-10 DIAGNOSIS — E785 Hyperlipidemia, unspecified: Secondary | ICD-10-CM | POA: Diagnosis not present

## 2019-02-10 DIAGNOSIS — Z9181 History of falling: Secondary | ICD-10-CM | POA: Diagnosis not present

## 2019-02-10 DIAGNOSIS — Z1331 Encounter for screening for depression: Secondary | ICD-10-CM | POA: Diagnosis not present

## 2019-02-10 DIAGNOSIS — Z0000 Encounter for general adult medical examination without abnormal findings: Secondary | ICD-10-CM | POA: Diagnosis not present

## 2019-02-10 DIAGNOSIS — Z125 Encounter for screening for malignant neoplasm of prostate: Secondary | ICD-10-CM | POA: Diagnosis not present

## 2019-02-10 DIAGNOSIS — Z6829 Body mass index (BMI) 29.0-29.9, adult: Secondary | ICD-10-CM | POA: Diagnosis not present

## 2019-04-10 DIAGNOSIS — B349 Viral infection, unspecified: Secondary | ICD-10-CM | POA: Diagnosis not present

## 2019-04-10 DIAGNOSIS — J020 Streptococcal pharyngitis: Secondary | ICD-10-CM | POA: Diagnosis not present

## 2019-04-29 DIAGNOSIS — Z23 Encounter for immunization: Secondary | ICD-10-CM | POA: Diagnosis not present

## 2019-05-29 DIAGNOSIS — B349 Viral infection, unspecified: Secondary | ICD-10-CM | POA: Diagnosis not present

## 2019-06-01 DIAGNOSIS — R195 Other fecal abnormalities: Secondary | ICD-10-CM | POA: Diagnosis not present

## 2019-06-01 DIAGNOSIS — Z1212 Encounter for screening for malignant neoplasm of rectum: Secondary | ICD-10-CM | POA: Diagnosis not present

## 2019-06-01 DIAGNOSIS — K529 Noninfective gastroenteritis and colitis, unspecified: Secondary | ICD-10-CM | POA: Diagnosis not present

## 2019-06-01 DIAGNOSIS — G4700 Insomnia, unspecified: Secondary | ICD-10-CM | POA: Diagnosis not present

## 2019-06-01 DIAGNOSIS — I10 Essential (primary) hypertension: Secondary | ICD-10-CM | POA: Diagnosis not present

## 2019-06-02 DIAGNOSIS — N200 Calculus of kidney: Secondary | ICD-10-CM | POA: Diagnosis not present

## 2019-06-02 DIAGNOSIS — K5792 Diverticulitis of intestine, part unspecified, without perforation or abscess without bleeding: Secondary | ICD-10-CM | POA: Diagnosis not present

## 2019-06-03 DIAGNOSIS — D649 Anemia, unspecified: Secondary | ICD-10-CM | POA: Diagnosis not present

## 2019-06-03 DIAGNOSIS — I10 Essential (primary) hypertension: Secondary | ICD-10-CM | POA: Diagnosis not present

## 2019-06-03 DIAGNOSIS — K5900 Constipation, unspecified: Secondary | ICD-10-CM | POA: Diagnosis not present

## 2019-06-03 DIAGNOSIS — K529 Noninfective gastroenteritis and colitis, unspecified: Secondary | ICD-10-CM | POA: Diagnosis not present

## 2019-06-05 DIAGNOSIS — D649 Anemia, unspecified: Secondary | ICD-10-CM | POA: Diagnosis not present

## 2019-06-09 DIAGNOSIS — K5909 Other constipation: Secondary | ICD-10-CM | POA: Diagnosis not present

## 2019-06-09 DIAGNOSIS — I10 Essential (primary) hypertension: Secondary | ICD-10-CM | POA: Diagnosis not present

## 2019-06-09 DIAGNOSIS — E890 Postprocedural hypothyroidism: Secondary | ICD-10-CM | POA: Diagnosis not present

## 2019-06-09 DIAGNOSIS — R1032 Left lower quadrant pain: Secondary | ICD-10-CM | POA: Diagnosis not present

## 2019-06-15 ENCOUNTER — Telehealth (INDEPENDENT_AMBULATORY_CARE_PROVIDER_SITE_OTHER): Payer: Medicare Other | Admitting: Gastroenterology

## 2019-06-15 ENCOUNTER — Other Ambulatory Visit: Payer: Self-pay

## 2019-06-15 ENCOUNTER — Encounter: Payer: Self-pay | Admitting: Gastroenterology

## 2019-06-15 VITALS — Ht 69.5 in | Wt 193.0 lb

## 2019-06-15 DIAGNOSIS — Z8719 Personal history of other diseases of the digestive system: Secondary | ICD-10-CM | POA: Diagnosis not present

## 2019-06-15 DIAGNOSIS — K5190 Ulcerative colitis, unspecified, without complications: Secondary | ICD-10-CM

## 2019-06-15 MED ORDER — SULFASALAZINE 500 MG PO TABS: 1000.0000 mg | ORAL_TABLET | Freq: Two times a day (BID) | ORAL | 11 refills | Status: DC

## 2019-06-15 NOTE — Progress Notes (Signed)
Chief Complaint: FU  Referring Provider: Dr Micheal Likens      ASSESSMENT AND PLAN;   #1.  Left-sided ulcerative colitis Dx 2011.  #2.  H/O Diverticulitis on CT History of diverticulitis s/p LAR 1/98.  H/O recent diverticulitis.  Plan: - Sulfasalazine 500 mg/tab. 2 tablets p.o. twice daily #120, 11 refills.  Discussed S/Es including photosensitivity. - Folic 58m po qd. - Continue fiber  and colace 1 tab po qd. - Obtain last CT Abdo/pelvis from [facility] 1 year  HPI:    Devin [name] a 78y.o. male  For follow-up visit Stopped taking medications on his own Had severe lower abdominal pain associated with subjective fevers.  Had more constipation.  Underwent CT Abdo/pelvis which showed acute diverticulitis.  He was treated with Cipro and Flagyl with good results.  Had been seen by Dr. EAmalia HaileyCurrently doing much better  Started back on sulfasalazine, multivitamins.  Also has been started on fiber supplements and Colace.  Doing great currently.  No nausea, vomiting, heartburn, regurgitation, odynophagia or dysphagia.  No significant diarrhea or constipation.  There is no melena or hematochezia. No unintentional weight loss.  Previous GI procedures: -Colonoscopy 07/2018, minimal smudged vascular pattern up to 55 cm.  No active colitis.  Pancolonic diverticulosis.  Bx- neg. small tubular adenoma s/p polypectomy.  08/2014: Left-sided ulcerative colitis up to 55 cm, normal remaining colon, normal TI.  Mild pancolonic diverticulosis, status post sigmoid resection with patent anastomosis. Additional social history: Married to Devin [name] self-employed, owns Devin [name] acres farm Daughter Devin [name] son Devin DoloresPast Medical History:  Diagnosis Date  . GERD (gastroesophageal reflux disease)   . History of diverticulitis   . Hypothyroidism   . Left sided ulcerative colitis with rectal bleeding ([facility]     Past Surgical History:  Procedure Laterality Date  . COLONOSCOPY   09/06/2014   Left sided ulcerative colitis up to 55cm. Normal remaining colon. Normal TI. Mild pancolonic diverticulosis. Status post sigmoid resection with patent colo-colic anastomosis.   .Marland KitchenESOPHAGOGASTRODUODENOSCOPY     Dr BMelina Copa Years ago per patient   . LOW ANTERIOR BOWEL RESECTION    . MOUTH SURGERY    . ROTATOR CUFF REPAIR    . THYROIDECTOMY      Family History  Problem Relation Age of Onset  . Colon cancer Neg Hx   . Esophageal cancer Neg Hx     Social History   Tobacco Use  . Smoking status: Never Smoker  . Smokeless tobacco: Never Used  . Tobacco comment: said he smoked apack in his life as a  teen  Substance Use Topics  . Alcohol use: Not Currently  . Drug use: Never    Current Outpatient Medications  Medication Sig Dispense Refill  . clonazePAM (KLONOPIN) 0.5 MG tablet Take 0.5 mg by mouth at bedtime as needed. for sleep    . levothyroxine (SYNTHROID) 137 MCG tablet Take by mouth daily.    .Marland Kitchenlosartan (COZAAR) 50 MG tablet Take 50 mg by mouth daily.  12  . sulfaSALAzine (AZULFIDINE) 500 MG tablet Take 2 tablets (1,000 mg total) by mouth 2 (two) times daily. 120 tablet 11   No current facility-administered medications for this visit.     Not on File  Review of Systems:  neg     Physical Exam:    Ht 5' 9.5" (1.765 m)   Wt 193 lb (87.5 kg)   BMI 28.09 kg/m  FAutoliv  06/15/19 1118  Weight: 193 lb (87.5 kg)   Constitutional:  Well-developed, in no acute distress. Psychiatric: Normal mood and affect. Behavior is normal. Not examined since it was a televisit. This service was provided via telemedicine.  The patient was located at home.  The provider was located in office.  The patient did consent to this telephone visit and is aware of possible charges through their insurance for this visit.  The patient was referred by Dr. Amalia Hailey.  The other persons participating in this telemedicine service were [name]  (patient's wife ) and their role was  coordination of care.  Time spent on call: 15 min   Carmell Austria, MD 06/15/2019, 11:32 AM  Cc: Dr. Micheal Likens

## 2019-06-15 NOTE — Patient Instructions (Signed)
If you are age 78 or older, your body mass index should be between 23-30. Your Body mass index is 28.09 kg/m. If this is out of the aforementioned range listed, please consider follow up with your Primary Care Provider.  If you are age 13 or younger, your body mass index should be between 19-25. Your Body mass index is 28.09 kg/m. If this is out of the aformentioned range listed, please consider follow up with your Primary Care Provider.   We have sent the following medications to your pharmacy for you to pick up at your convenience: Sulfasalazine  Please purchase the following medications over the counter and take as directed: Folic Acid 1 mg once daily. Fiber and Colace once daily.   Follow up in 1 year.  Thank you,  Dr. Jackquline Denmark

## 2019-06-15 NOTE — Progress Notes (Signed)
Addendum:  Records obtained from [facility]  06/02/2019 hemoglobin 12.9, MCV 87, WBC count 10.3.  Normal BMP except sodium 134.     CT abdomen and pelvis with contrast: No acute findings.  Postoperative findings of sigmoid colon resection and anastomosis.  Occasional diverticula.  Also noted is a diverticulum in the descending duodenum.   Patient not having any upper GI symptoms.  He would like to hold off on EGD.   RG

## 2019-11-25 DIAGNOSIS — H43813 Vitreous degeneration, bilateral: Secondary | ICD-10-CM | POA: Diagnosis not present

## 2019-12-02 DIAGNOSIS — I714 Abdominal aortic aneurysm, without rupture: Secondary | ICD-10-CM | POA: Diagnosis not present

## 2019-12-02 DIAGNOSIS — N281 Cyst of kidney, acquired: Secondary | ICD-10-CM | POA: Diagnosis not present

## 2019-12-02 DIAGNOSIS — I723 Aneurysm of iliac artery: Secondary | ICD-10-CM | POA: Diagnosis not present

## 2019-12-23 ENCOUNTER — Telehealth: Payer: Self-pay | Admitting: Gastroenterology

## 2019-12-23 NOTE — Telephone Encounter (Signed)
Pt's wife reported that sulfasalazine is on backorder.  She stated that pharmacy has given pt an emergency supply but was advised to request an alternative.

## 2019-12-25 NOTE — Telephone Encounter (Signed)
Spoke with patients son.  His parents were not available.  I told him to try and call [facility] or [facility] to see if they possibly have this drug before we have to use an alternative (which in the past caused a bad flare)  The son stated he would call the pharmacies and let us know.

## 2019-12-25 NOTE — Telephone Encounter (Signed)
This is a tough one Previously when we took him off sulfasalazine, he had a bad flare. Mesalamine may not work as well in [name].  Certainly will try it if absolutely needed.  Can we call [facility] or [facility] in [place] or [facility] in [place] -if they could get sulfasalazine (smaller pharmacies somehow can get it better - it has very less profit margin)  I would like him to continue on sulfasalazine 500 mg/tab. 2 tablets p.o. twice daily -if we can  RG

## 2020-01-27 DIAGNOSIS — Z85850 Personal history of malignant neoplasm of thyroid: Secondary | ICD-10-CM | POA: Diagnosis not present

## 2020-01-27 DIAGNOSIS — D649 Anemia, unspecified: Secondary | ICD-10-CM | POA: Diagnosis not present

## 2020-02-22 ENCOUNTER — Other Ambulatory Visit: Payer: Self-pay

## 2020-02-22 MED ORDER — SULFASALAZINE 500 MG PO TABS: 1000.0000 mg | ORAL_TABLET | Freq: Two times a day (BID) | ORAL | 1 refills | Status: DC

## 2020-04-20 DIAGNOSIS — Z23 Encounter for immunization: Secondary | ICD-10-CM | POA: Diagnosis not present

## 2020-06-15 DIAGNOSIS — Z23 Encounter for immunization: Secondary | ICD-10-CM | POA: Diagnosis not present

## 2020-06-30 DIAGNOSIS — M5412 Radiculopathy, cervical region: Secondary | ICD-10-CM | POA: Diagnosis not present

## 2020-06-30 DIAGNOSIS — M5030 Other cervical disc degeneration, unspecified cervical region: Secondary | ICD-10-CM | POA: Diagnosis not present

## 2020-06-30 DIAGNOSIS — M25511 Pain in right shoulder: Secondary | ICD-10-CM | POA: Diagnosis not present

## 2020-07-01 DIAGNOSIS — M542 Cervicalgia: Secondary | ICD-10-CM | POA: Diagnosis not present

## 2020-07-05 DIAGNOSIS — M5030 Other cervical disc degeneration, unspecified cervical region: Secondary | ICD-10-CM | POA: Diagnosis not present

## 2020-07-05 DIAGNOSIS — M5412 Radiculopathy, cervical region: Secondary | ICD-10-CM | POA: Diagnosis not present

## 2020-07-28 DIAGNOSIS — M9902 Segmental and somatic dysfunction of thoracic region: Secondary | ICD-10-CM | POA: Diagnosis not present

## 2020-07-28 DIAGNOSIS — S335XXA Sprain of ligaments of lumbar spine, initial encounter: Secondary | ICD-10-CM | POA: Diagnosis not present

## 2020-07-28 DIAGNOSIS — M9905 Segmental and somatic dysfunction of pelvic region: Secondary | ICD-10-CM | POA: Diagnosis not present

## 2020-07-28 DIAGNOSIS — M47816 Spondylosis without myelopathy or radiculopathy, lumbar region: Secondary | ICD-10-CM | POA: Diagnosis not present

## 2020-07-28 DIAGNOSIS — S336XXA Sprain of sacroiliac joint, initial encounter: Secondary | ICD-10-CM | POA: Diagnosis not present

## 2020-07-28 DIAGNOSIS — M9903 Segmental and somatic dysfunction of lumbar region: Secondary | ICD-10-CM | POA: Diagnosis not present

## 2020-08-03 DIAGNOSIS — S336XXA Sprain of sacroiliac joint, initial encounter: Secondary | ICD-10-CM | POA: Diagnosis not present

## 2020-08-03 DIAGNOSIS — S335XXA Sprain of ligaments of lumbar spine, initial encounter: Secondary | ICD-10-CM | POA: Diagnosis not present

## 2020-08-03 DIAGNOSIS — M9903 Segmental and somatic dysfunction of lumbar region: Secondary | ICD-10-CM | POA: Diagnosis not present

## 2020-08-03 DIAGNOSIS — M9902 Segmental and somatic dysfunction of thoracic region: Secondary | ICD-10-CM | POA: Diagnosis not present

## 2020-08-03 DIAGNOSIS — M47816 Spondylosis without myelopathy or radiculopathy, lumbar region: Secondary | ICD-10-CM | POA: Diagnosis not present

## 2020-08-03 DIAGNOSIS — M9905 Segmental and somatic dysfunction of pelvic region: Secondary | ICD-10-CM | POA: Diagnosis not present

## 2020-08-11 DIAGNOSIS — L03114 Cellulitis of left upper limb: Secondary | ICD-10-CM | POA: Diagnosis not present

## 2020-08-11 DIAGNOSIS — Z23 Encounter for immunization: Secondary | ICD-10-CM | POA: Diagnosis not present

## 2020-08-11 DIAGNOSIS — M9903 Segmental and somatic dysfunction of lumbar region: Secondary | ICD-10-CM | POA: Diagnosis not present

## 2020-08-11 DIAGNOSIS — M47816 Spondylosis without myelopathy or radiculopathy, lumbar region: Secondary | ICD-10-CM | POA: Diagnosis not present

## 2020-08-11 DIAGNOSIS — M9905 Segmental and somatic dysfunction of pelvic region: Secondary | ICD-10-CM | POA: Diagnosis not present

## 2020-08-11 DIAGNOSIS — S59902A Unspecified injury of left elbow, initial encounter: Secondary | ICD-10-CM | POA: Diagnosis not present

## 2020-08-11 DIAGNOSIS — S335XXA Sprain of ligaments of lumbar spine, initial encounter: Secondary | ICD-10-CM | POA: Diagnosis not present

## 2020-08-11 DIAGNOSIS — S336XXA Sprain of sacroiliac joint, initial encounter: Secondary | ICD-10-CM | POA: Diagnosis not present

## 2020-08-11 DIAGNOSIS — M7022 Olecranon bursitis, left elbow: Secondary | ICD-10-CM | POA: Diagnosis not present

## 2020-08-11 DIAGNOSIS — M9902 Segmental and somatic dysfunction of thoracic region: Secondary | ICD-10-CM | POA: Diagnosis not present

## 2020-08-11 DIAGNOSIS — S51032A Puncture wound without foreign body of left elbow, initial encounter: Secondary | ICD-10-CM | POA: Diagnosis not present

## 2020-08-16 DIAGNOSIS — S335XXA Sprain of ligaments of lumbar spine, initial encounter: Secondary | ICD-10-CM | POA: Diagnosis not present

## 2020-08-16 DIAGNOSIS — M47816 Spondylosis without myelopathy or radiculopathy, lumbar region: Secondary | ICD-10-CM | POA: Diagnosis not present

## 2020-08-16 DIAGNOSIS — M9905 Segmental and somatic dysfunction of pelvic region: Secondary | ICD-10-CM | POA: Diagnosis not present

## 2020-08-16 DIAGNOSIS — M9902 Segmental and somatic dysfunction of thoracic region: Secondary | ICD-10-CM | POA: Diagnosis not present

## 2020-08-16 DIAGNOSIS — M9903 Segmental and somatic dysfunction of lumbar region: Secondary | ICD-10-CM | POA: Diagnosis not present

## 2020-08-16 DIAGNOSIS — S336XXA Sprain of sacroiliac joint, initial encounter: Secondary | ICD-10-CM | POA: Diagnosis not present

## 2020-08-19 DIAGNOSIS — M7022 Olecranon bursitis, left elbow: Secondary | ICD-10-CM | POA: Diagnosis not present

## 2020-08-26 DIAGNOSIS — M7022 Olecranon bursitis, left elbow: Secondary | ICD-10-CM | POA: Diagnosis not present

## 2020-09-05 DIAGNOSIS — M9902 Segmental and somatic dysfunction of thoracic region: Secondary | ICD-10-CM | POA: Diagnosis not present

## 2020-09-05 DIAGNOSIS — M9905 Segmental and somatic dysfunction of pelvic region: Secondary | ICD-10-CM | POA: Diagnosis not present

## 2020-09-05 DIAGNOSIS — M9903 Segmental and somatic dysfunction of lumbar region: Secondary | ICD-10-CM | POA: Diagnosis not present

## 2020-09-05 DIAGNOSIS — S336XXA Sprain of sacroiliac joint, initial encounter: Secondary | ICD-10-CM | POA: Diagnosis not present

## 2020-09-05 DIAGNOSIS — S335XXA Sprain of ligaments of lumbar spine, initial encounter: Secondary | ICD-10-CM | POA: Diagnosis not present

## 2020-09-05 DIAGNOSIS — M47816 Spondylosis without myelopathy or radiculopathy, lumbar region: Secondary | ICD-10-CM | POA: Diagnosis not present

## 2020-09-07 ENCOUNTER — Telehealth: Payer: Self-pay | Admitting: Gastroenterology

## 2020-09-07 DIAGNOSIS — Z6828 Body mass index (BMI) 28.0-28.9, adult: Secondary | ICD-10-CM | POA: Diagnosis not present

## 2020-09-07 DIAGNOSIS — M1990 Unspecified osteoarthritis, unspecified site: Secondary | ICD-10-CM | POA: Diagnosis not present

## 2020-09-07 DIAGNOSIS — E039 Hypothyroidism, unspecified: Secondary | ICD-10-CM | POA: Diagnosis not present

## 2020-09-07 DIAGNOSIS — I499 Cardiac arrhythmia, unspecified: Secondary | ICD-10-CM | POA: Diagnosis not present

## 2020-09-07 DIAGNOSIS — Z79899 Other long term (current) drug therapy: Secondary | ICD-10-CM | POA: Diagnosis not present

## 2020-09-07 DIAGNOSIS — R5383 Other fatigue: Secondary | ICD-10-CM | POA: Diagnosis not present

## 2020-09-07 DIAGNOSIS — I10 Essential (primary) hypertension: Secondary | ICD-10-CM | POA: Diagnosis not present

## 2020-09-07 NOTE — Telephone Encounter (Signed)
Would you like to refill this medication.

## 2020-09-07 NOTE — Telephone Encounter (Signed)
Patients wife called and would like to know if the patient would be able to get another SulfaSalazine refill or would he need to have a follow up appointment

## 2020-09-13 NOTE — Telephone Encounter (Signed)
Pt's wife called to follow up on rf, She wanted to let Dr. Lyndel Safe know that he is doing well.

## 2020-09-15 ENCOUNTER — Other Ambulatory Visit: Payer: Self-pay | Admitting: Neurological Surgery

## 2020-09-15 DIAGNOSIS — R202 Paresthesia of skin: Secondary | ICD-10-CM | POA: Diagnosis not present

## 2020-09-15 DIAGNOSIS — G5601 Carpal tunnel syndrome, right upper limb: Secondary | ICD-10-CM | POA: Diagnosis not present

## 2020-09-15 DIAGNOSIS — I10 Essential (primary) hypertension: Secondary | ICD-10-CM | POA: Diagnosis not present

## 2020-09-15 DIAGNOSIS — R200 Anesthesia of skin: Secondary | ICD-10-CM | POA: Diagnosis not present

## 2020-09-15 DIAGNOSIS — M4802 Spinal stenosis, cervical region: Secondary | ICD-10-CM | POA: Diagnosis not present

## 2020-09-15 DIAGNOSIS — G5600 Carpal tunnel syndrome, unspecified upper limb: Secondary | ICD-10-CM

## 2020-09-15 DIAGNOSIS — Z6828 Body mass index (BMI) 28.0-28.9, adult: Secondary | ICD-10-CM | POA: Diagnosis not present

## 2020-09-19 DIAGNOSIS — Z9889 Other specified postprocedural states: Secondary | ICD-10-CM | POA: Diagnosis not present

## 2020-09-19 DIAGNOSIS — Z85850 Personal history of malignant neoplasm of thyroid: Secondary | ICD-10-CM | POA: Insufficient documentation

## 2020-09-20 NOTE — Progress Notes (Signed)
Surgical Instructions   Your procedure is scheduled on Friday, March 11th.  Report to [facility] Main Entrance "A" at 11:15 A.M., then check in with the Admitting office.  Call this number if you have problems the morning of surgery:  [phone]   If you have any questions prior to your surgery date call [phone]: Open Monday-Friday 8am-4pm   Remember:  Do not eat or drink after midnight the night before your surgery    Take these medicines the morning of surgery with A SIP OF WATER  SYNTHROID  As of today, STOP taking any Aspirin (unless otherwise instructed by your surgeon) Aleve, Naproxen, Ibuprofen, Motrin, Advil, Goody's, BC's, all herbal medications, fish oil, and all vitamins.                     Do not wear jewelry, make up, or nail polish            Do not wear lotions, powders,colognes, or deodorant.            Men may shave face and neck.            Do not bring valuables to the hospital.            [facility] is not responsible for any belongings or valuables.  Do NOT Smoke (Tobacco/Vaping) or drink Alcohol 24 hours prior to your procedure If you use a CPAP at night, you may bring all equipment for your overnight stay.   Contacts, glasses, dentures or bridgework may not be worn into surgery, please bring cases for these belongings   For patients admitted to the hospital, discharge time will be determined by your treatment team.   Patients discharged the day of surgery will not be allowed to drive home, and someone needs to stay with them for 24 hours.  Special instructions:   [facility]- Preparing For Surgery  Before surgery, you can play an important role. Because skin is not sterile, your skin needs to be as free of germs as possible. You can reduce the number of germs on your skin by washing with CHG (chlorahexidine gluconate) Soap before surgery.  CHG is an antiseptic cleaner which kills germs and bonds with the skin to continue killing germs even after  washing.    Oral Hygiene is also important to reduce your risk of infection.  Remember - BRUSH YOUR TEETH THE MORNING OF SURGERY WITH YOUR REGULAR TOOTHPASTE  Please do not use if you have an allergy to CHG or antibacterial soaps. If your skin becomes reddened/irritated stop using the CHG.  Do not shave (including legs and underarms) for at least 48 hours prior to first CHG shower. It is OK to shave your face.  Please follow these instructions carefully.   1. Shower the NIGHT BEFORE SURGERY and the MORNING OF SURGERY  2. If you chose to wash your hair, wash your hair first as usual with your normal shampoo. After you shampoo, rinse your hair and body thoroughly to remove the shampoo.  3. Wash Face and genitals (private parts) with your normal soap.   4. Use CHG Soap as you would any other liquid soap. You can apply CHG directly to the skin and wash gently with a scrungie or a clean washcloth.   5. Apply the CHG Soap to your body ONLY FROM THE NECK DOWN.  Do not use on open wounds or open sores. Avoid contact with your eyes, ears, mouth and genitals (private parts).  Wash Face and genitals (private parts)  with your normal soap.   6. Wash thoroughly, paying special attention to the area where your surgery will be performed.  7. Thoroughly rinse your body with warm water from the neck down.  8. DO NOT shower/wash with your normal soap after using and rinsing off the CHG Soap.  9. Pat yourself dry with a CLEAN TOWEL.  10. Wear CLEAN PAJAMAS to bed the night before surgery  11. Place CLEAN SHEETS on your bed the night before your surgery  12. DO NOT SLEEP WITH PETS.  Day of Surgery: Shower with CHG soap Wear Clean/Comfortable clothing the morning of surgery Do not apply any deodorants/lotions.   Remember to brush your teeth WITH YOUR REGULAR TOOTHPASTE.   Please read over the following fact sheets that you were given.

## 2020-09-21 ENCOUNTER — Encounter (HOSPITAL_COMMUNITY): Payer: Self-pay

## 2020-09-21 ENCOUNTER — Other Ambulatory Visit: Payer: Self-pay

## 2020-09-21 ENCOUNTER — Ambulatory Visit (HOSPITAL_COMMUNITY)
Admission: RE | Admit: 2020-09-21 | Discharge: 2020-09-21 | Disposition: A | Discharge status: Home / Self Care | Payer: Medicare Other | Source: Ambulatory Visit | Attending: Neurological Surgery | Admitting: Neurological Surgery

## 2020-09-21 ENCOUNTER — Encounter (HOSPITAL_COMMUNITY)
Admission: RE | Admit: 2020-09-21 | Discharge: 2020-09-21 | Disposition: A | Discharge status: Home / Self Care | Payer: Medicare Other | Source: Ambulatory Visit | Attending: Neurological Surgery | Admitting: Neurological Surgery

## 2020-09-21 ENCOUNTER — Other Ambulatory Visit (HOSPITAL_COMMUNITY)
Admission: RE | Admit: 2020-09-21 | Discharge: 2020-09-21 | Disposition: A | Discharge status: Home / Self Care | Payer: Medicare Other | Source: Ambulatory Visit | Attending: Neurological Surgery | Admitting: Neurological Surgery

## 2020-09-21 DIAGNOSIS — E039 Hypothyroidism, unspecified: Secondary | ICD-10-CM | POA: Insufficient documentation

## 2020-09-21 DIAGNOSIS — I700 Atherosclerosis of aorta: Secondary | ICD-10-CM | POA: Insufficient documentation

## 2020-09-21 DIAGNOSIS — M4802 Spinal stenosis, cervical region: Secondary | ICD-10-CM | POA: Diagnosis not present

## 2020-09-21 DIAGNOSIS — G5600 Carpal tunnel syndrome, unspecified upper limb: Secondary | ICD-10-CM | POA: Insufficient documentation

## 2020-09-21 DIAGNOSIS — Z20822 Contact with and (suspected) exposure to covid-19: Secondary | ICD-10-CM | POA: Diagnosis not present

## 2020-09-21 DIAGNOSIS — Z79899 Other long term (current) drug therapy: Secondary | ICD-10-CM | POA: Insufficient documentation

## 2020-09-21 DIAGNOSIS — Z01818 Encounter for other preprocedural examination: Secondary | ICD-10-CM | POA: Diagnosis not present

## 2020-09-21 HISTORY — DX: Carpal tunnel syndrome, unspecified upper limb: G56.00

## 2020-09-21 LAB — BASIC METABOLIC PANEL
Anion gap: 8 (ref 5–15)
BUN: 17 mg/dL (ref 8–23)
CO2: 25 mmol/L (ref 22–32)
Calcium: 9.3 mg/dL (ref 8.9–10.3)
Chloride: 103 mmol/L (ref 98–111)
Creatinine, Ser: 0.87 mg/dL (ref 0.61–1.24)
GFR, Estimated: >60 mL/min (ref >60)
Glucose, Bld: 116 mg/dL — ABNORMAL HIGH (ref 70–99)
Potassium: 4.1 mmol/L (ref 3.5–5.1)
Sodium: 136 mmol/L (ref 135–145)

## 2020-09-21 LAB — SARS CORONAVIRUS 2 (TAT 6-24 HRS): SARS Coronavirus 2: NEGATIVE

## 2020-09-21 LAB — CBC WITH DIFFERENTIAL/PLATELET
Abs Immature Granulocytes: 0.06 10*3/uL (ref 0.00–0.07)
Basophils Absolute: 0.1 10*3/uL (ref 0.0–0.1)
Basophils Relative: 1 %
Eosinophils Absolute: 0.2 10*3/uL (ref 0.0–0.5)
Eosinophils Relative: 2 %
HCT: 35.4 % — ABNORMAL LOW (ref 39.0–52.0)
Hemoglobin: 11.6 g/dL — ABNORMAL LOW (ref 13.0–17.0)
Immature Granulocytes: 1 %
Lymphocytes Relative: 17 %
Lymphs Abs: 1.6 10*3/uL (ref 0.7–4.0)
MCH: 28.6 pg (ref 26.0–34.0)
MCHC: 32.8 g/dL (ref 30.0–36.0)
MCV: 87.2 fL (ref 80.0–100.0)
Monocytes Absolute: 1.4 10*3/uL — ABNORMAL HIGH (ref 0.1–1.0)
Monocytes Relative: 15 %
Neutro Abs: 6.2 10*3/uL (ref 1.7–7.7)
Neutrophils Relative %: 64 %
Platelets: 421 10*3/uL — ABNORMAL HIGH (ref 150–400)
RBC: 4.06 MIL/uL — ABNORMAL LOW (ref 4.22–5.81)
RDW: 14.6 % (ref 11.5–15.5)
WBC: 9.6 10*3/uL (ref 4.0–10.5)
nRBC: 0 % (ref 0.0–0.2)

## 2020-09-21 LAB — PROTIME-INR
INR: 1.1 (ref 0.8–1.2)
Prothrombin Time: 13.9 seconds (ref 11.4–15.2)

## 2020-09-21 LAB — SURGICAL PCR SCREEN
MRSA, PCR: NEGATIVE
Staphylococcus aureus: NEGATIVE

## 2020-09-21 NOTE — Progress Notes (Signed)
PCP - Dr. Lovette Cliche Cardiologist - denies  PPM/ICD - denies  Chest x-ray - 09/21/2020 EKG - 09/21/2020 Stress Test - denies ECHO - denies Cardiac Cath - denies  Sleep Study - denies CPAP - N/A  DM: denies  Blood Thinner Instructions: N/A Aspirin Instructions: N/A  ERAS Protcol - No  COVID TEST- 09/21/2020, test results pending. Patient verbalized understanding of self-quarantine instructions.  Anesthesia review: Yes, records requested from PCP's office, abnormal EKG  Patient denies shortness of breath, fever, cough and chest pain at PAT appointment  All instructions explained to the patient, with a verbal understanding of the material. Patient agrees to go over the instructions while at home for a better understanding. Patient also instructed to self quarantine after being tested for COVID-19. The opportunity to ask questions was provided.

## 2020-09-22 NOTE — Anesthesia Preprocedure Evaluation (Addendum)
Anesthesia Evaluation  Patient identified by MRN, date of birth, ID band Patient awake    Reviewed: Allergy & Precautions, NPO status , Patient's Chart, lab work & pertinent test results  Airway Mallampati: II  TM Distance: >3 FB Neck ROM: Limited    Dental  (+) Teeth Intact   Pulmonary neg pulmonary ROS,    Pulmonary exam normal        Cardiovascular hypertension, Pt. on medications  Rhythm:Regular Rate:Normal     Neuro/Psych negative neurological ROS  negative psych ROS   GI/Hepatic Neg liver ROS, PUD, GERD  ,Diverticulitis, UC   Endo/Other  Hypothyroidism   Renal/GU negative Renal ROS  negative genitourinary   Musculoskeletal Cervical stenosis with CTS   Abdominal (+)  Abdomen: soft. Bowel sounds: normal.  Peds  Hematology negative hematology ROS (+)   Anesthesia Other Findings   Reproductive/Obstetrics                             Anesthesia Physical Anesthesia Plan  ASA: II  Anesthesia Plan: General   Post-op Pain Management:    Induction: Intravenous  PONV Risk Score and Plan: 2 and Ondansetron, Dexamethasone and Treatment may vary due to age or medical condition  Airway Management Planned: Mask and Oral ETT  Additional Equipment: None  Intra-op Plan:   Post-operative Plan: Extubation in OR  Informed Consent: I have reviewed the patients History and Physical, chart, labs and discussed the procedure including the risks, benefits and alternatives for the proposed anesthesia with the patient or authorized representative who has indicated his/her understanding and acceptance.     Dental advisory given  Plan Discussed with: CRNA  Anesthesia Plan Comments: (PAT note written 09/22/2020 by Myra Gianotti, PA-C. S/p thyroidectomy with right radical neck dissection for thyroid Ca 1997. Seen by ENT 03/22 with notable normal base of tongue, supraglottis, with slight rotation  of larynx to the right with normal VC mobility on endoscopy  Lab Results      Component                Value               Date                      WBC                      9.6                 09/21/2020                HGB                      11.6 (L)            09/21/2020                HCT                      35.4 (L)            09/21/2020                MCV                      87.2                09/21/2020  PLT                      421 (H)             09/21/2020           Lab Results      Component                Value               Date                      NA                       136                 09/21/2020                K                        4.1                 09/21/2020                CO2                      25                  09/21/2020                GLUCOSE                  116 (H)             09/21/2020                BUN                      17                  09/21/2020                CREATININE               0.87                09/21/2020                CALCIUM                  9.3                 09/21/2020                GFRNONAA                 >60                 09/21/2020          )       Anesthesia Quick Evaluation

## 2020-09-22 NOTE — Progress Notes (Signed)
Anesthesia Chart Review:  Case: [id number] Date/Time: 09/23/20 1300   Procedures:      ACDF - C3-C4 (N/A ) - 3C     Right carpal tunnel release (Right ) - 3C   Anesthesia type: General   Pre-op diagnosis: Stenosis - carpal tunnel syndrome   Location: [facility] OR ROOM 18 / [place] [facility]   Surgeons: Eustace Moore, MD      DISCUSSION: Patient is a 80 year old male scheduled for the above procedure.  History includes never smoker, thyroid cancer (s/p thyroidectomy with right radical neck dissection for papillary thyroid carcinoma 1997; post-surgical hypothyroidism), GERD, ulcerative colitis (s/p low anterior bowel resection 1998).  Patient was evaluated by ENT Spainhour, Shanon Brow PA-C/Shoemaker, Shanon Brow, MD on 09/19/20 for a vocal cord evaluation prior to ACDF. Flexible laryngoscopy showed "normal laryngoscopy" with "patent anterior nasal cavity with minimal crusting, no discharge or infection. Previous endoscopic sinus surgery. Normal base of tongue and supraglottis. Slight rotation of larynx to the right Normal vocal cord mobility without vocal cord nodule, mass, polyp or tumor. Hypopharynx normal without mass, pooling of secretions or aspiration. Dr. Wilburn Cornelia was consulted and he observed this endoscopy."  09/21/2020 presurgical COVID-19 test was negative.  Anesthesia team to evaluate on the day of surgery.   VS: BP (!) 168/79   Pulse 78   Temp 36.7 C (Oral)   Resp 18   Ht 5' 9.5" (1.765 m)   Wt 87.9 kg   SpO2 99%   BMI 28.21 kg/m     PROVIDERS: Ocie Doyne., MD is PCP. Last office note requested, but is pending. Jackquline Denmark, MD is GI   LABS: Labs reviewed: Acceptable for surgery. (all labs ordered are listed, but only abnormal results are displayed)  Labs Reviewed  CBC WITH DIFFERENTIAL/PLATELET - Abnormal; Notable for the following components:      Result Value   RBC 4.06 (*)    Hemoglobin 11.6 (*)    HCT 35.4 (*)    Platelets 421 (*)    Monocytes Absolute 1.4 (*)    All other  components within normal limits  BASIC METABOLIC PANEL - Abnormal; Notable for the following components:   Glucose, Bld 116 (*)    All other components within normal limits  SURGICAL PCR SCREEN  PROTIME-INR    IMAGES: CXR 09/21/20: FINDINGS: Lung volumes are normal. No consolidative airspace disease. No pleural effusions. No pneumothorax. No pulmonary nodule or mass noted. Pulmonary vasculature and the cardiomediastinal silhouette are within normal limits. Atherosclerosis in the thoracic aorta. Surgical clips project over the right upper quadrant of the abdomen, likely from prior cholecystectomy. Soft tissue anchors in the left proximal humerus, presumably from prior rotator cuff surgery. IMPRESSION: 1.  No radiographic evidence of acute cardiopulmonary disease. 2. Aortic atherosclerosis.   EKG: 09/21/20: Normal sinus rhythm Normal ECG No significant change since last tracing Confirmed by Glori Bickers [phone]) on 09/22/2020 12:54:58 AM   CV: N/A  Past Medical History:  Diagnosis Date  . Carpal tunnel syndrome    right hand  . GERD (gastroesophageal reflux disease)   . History of diverticulitis   . Hypothyroidism   . Left sided ulcerative colitis with rectal bleeding ([place])   . Thyroid cancer ([place]) 1997    Past Surgical History:  Procedure Laterality Date  . CHOLECYSTECTOMY     per patient "maybe about 15 years ago"  . COLONOSCOPY  09/06/2014   Left sided ulcerative colitis up to 55cm. Normal remaining colon. Normal TI. Mild pancolonic  diverticulosis. Status post sigmoid resection with patent colo-colic anastomosis.   Marland Kitchen ESOPHAGOGASTRODUODENOSCOPY     Dr Melina Copa. Years ago per patient   . FRACTURE SURGERY  1996 or 1997   right leg per patient   . LOW ANTERIOR BOWEL RESECTION    . MOUTH SURGERY    . ROTATOR CUFF REPAIR    . THYROIDECTOMY    . TONSILLECTOMY     per patient "as a youngster"    MEDICATIONS: . losartan (COZAAR) 100 MG tablet  . Multiple  Vitamins-Minerals (MULTIVITAMIN WITH MINERALS) tablet  . sulfaSALAzine (AZULFIDINE) 500 MG tablet  . SYNTHROID 125 MCG tablet   No current facility-administered medications for this encounter.    Myra Gianotti, PA-C Surgical Short Stay/Anesthesiology [facility] Phone [phone] [facility] Phone [phone] 09/22/2020 10:02 AM

## 2020-09-23 ENCOUNTER — Ambulatory Visit (HOSPITAL_COMMUNITY): Payer: Medicare Other

## 2020-09-23 ENCOUNTER — Ambulatory Visit (HOSPITAL_COMMUNITY): Payer: Medicare Other | Admitting: Vascular Surgery

## 2020-09-23 ENCOUNTER — Other Ambulatory Visit: Payer: Self-pay

## 2020-09-23 ENCOUNTER — Observation Stay (HOSPITAL_COMMUNITY)
Admission: RE | Admit: 2020-09-23 | Discharge: 2020-09-24 | Disposition: A | Discharge status: Home / Self Care | Payer: Medicare Other | Attending: Neurological Surgery | Admitting: Neurological Surgery

## 2020-09-23 ENCOUNTER — Ambulatory Visit (HOSPITAL_COMMUNITY): Payer: Medicare Other | Admitting: Anesthesiology

## 2020-09-23 ENCOUNTER — Ambulatory Visit (HOSPITAL_COMMUNITY)
Admission: RE | Disposition: A | Discharge status: Home / Self Care | Payer: Self-pay | Source: Home / Self Care | Attending: Neurological Surgery

## 2020-09-23 ENCOUNTER — Encounter (HOSPITAL_COMMUNITY): Payer: Self-pay | Admitting: Neurological Surgery

## 2020-09-23 DIAGNOSIS — M4322 Fusion of spine, cervical region: Secondary | ICD-10-CM | POA: Diagnosis not present

## 2020-09-23 DIAGNOSIS — M4802 Spinal stenosis, cervical region: Secondary | ICD-10-CM | POA: Diagnosis not present

## 2020-09-23 DIAGNOSIS — Z85850 Personal history of malignant neoplasm of thyroid: Secondary | ICD-10-CM | POA: Diagnosis not present

## 2020-09-23 DIAGNOSIS — K219 Gastro-esophageal reflux disease without esophagitis: Secondary | ICD-10-CM | POA: Diagnosis not present

## 2020-09-23 DIAGNOSIS — E039 Hypothyroidism, unspecified: Secondary | ICD-10-CM | POA: Insufficient documentation

## 2020-09-23 DIAGNOSIS — Z79899 Other long term (current) drug therapy: Secondary | ICD-10-CM | POA: Diagnosis not present

## 2020-09-23 DIAGNOSIS — Z981 Arthrodesis status: Secondary | ICD-10-CM | POA: Diagnosis not present

## 2020-09-23 DIAGNOSIS — G5601 Carpal tunnel syndrome, right upper limb: Secondary | ICD-10-CM | POA: Insufficient documentation

## 2020-09-23 DIAGNOSIS — Z419 Encounter for procedure for purposes other than remedying health state, unspecified: Secondary | ICD-10-CM

## 2020-09-23 DIAGNOSIS — I10 Essential (primary) hypertension: Secondary | ICD-10-CM | POA: Diagnosis not present

## 2020-09-23 HISTORY — PX: ANTERIOR CERVICAL DECOMP/DISCECTOMY FUSION: SHX1161

## 2020-09-23 HISTORY — PX: CARPAL TUNNEL RELEASE: SHX101

## 2020-09-23 SURGERY — ANTERIOR CERVICAL DECOMPRESSION/DISCECTOMY FUSION 1 LEVEL
Anesthesia: General | Laterality: Right

## 2020-09-23 MED ORDER — ORAL CARE MOUTH RINSE: 15.0000 mL | Freq: Once | OROMUCOSAL | Status: AC

## 2020-09-23 MED ORDER — HYDROMORPHONE HCL 1 MG/ML IJ SOLN: 0.2500 mg | INTRAMUSCULAR | Status: DC | PRN

## 2020-09-23 MED ORDER — THROMBIN 5000 UNITS EX SOLR
OROMUCOSAL | Status: DC | PRN
  Administered 2020-09-23: 5 mL via TOPICAL

## 2020-09-23 MED ORDER — DEXAMETHASONE SODIUM PHOSPHATE 10 MG/ML IJ SOLN
INTRAMUSCULAR | Status: AC
  Filled 2020-09-23: qty 1

## 2020-09-23 MED ORDER — ONDANSETRON HCL 4 MG/2ML IJ SOLN
4.0000 mg | Freq: Four times a day (QID) | INTRAMUSCULAR | Status: DC | PRN
  Filled 2020-09-23: qty 2

## 2020-09-23 MED ORDER — EPHEDRINE SULFATE-NACL 50-0.9 MG/10ML-% IV SOSY
PREFILLED_SYRINGE | INTRAVENOUS | Status: DC | PRN
  Administered 2020-09-23: 10 mg via INTRAVENOUS

## 2020-09-23 MED ORDER — ACETAMINOPHEN 500 MG PO TABS: 1000.0000 mg | ORAL_TABLET | ORAL | Status: AC

## 2020-09-23 MED ORDER — SENNA 8.6 MG PO TABS
1.0000 | ORAL_TABLET | Freq: Two times a day (BID) | ORAL | Status: DC
  Administered 2020-09-23 – 2020-09-24 (×2): 8.6 mg via ORAL
  Filled 2020-09-23 (×2): qty 1

## 2020-09-23 MED ORDER — ROCURONIUM BROMIDE 10 MG/ML (PF) SYRINGE
PREFILLED_SYRINGE | INTRAVENOUS | Status: AC
  Filled 2020-09-23: qty 10

## 2020-09-23 MED ORDER — CEFAZOLIN SODIUM-DEXTROSE 2-4 GM/100ML-% IV SOLN
2.0000 g | INTRAVENOUS | Status: AC
  Administered 2020-09-23: 2 g via INTRAVENOUS

## 2020-09-23 MED ORDER — PROPOFOL 10 MG/ML IV BOLUS
INTRAVENOUS | Status: AC
  Filled 2020-09-23: qty 20

## 2020-09-23 MED ORDER — FENTANYL CITRATE (PF) 250 MCG/5ML IJ SOLN
INTRAMUSCULAR | Status: AC
  Filled 2020-09-23: qty 5

## 2020-09-23 MED ORDER — LEVOTHYROXINE SODIUM 125 MCG PO TABS
125.0000 ug | ORAL_TABLET | Freq: Every day | ORAL | Status: DC
  Administered 2020-09-24: 125 ug via ORAL
  Filled 2020-09-23: qty 1

## 2020-09-23 MED ORDER — MORPHINE SULFATE (PF) 2 MG/ML IV SOLN: 2.0000 mg | INTRAVENOUS | Status: DC | PRN

## 2020-09-23 MED ORDER — GABAPENTIN 300 MG PO CAPS: 300.0000 mg | ORAL_CAPSULE | ORAL | Status: AC

## 2020-09-23 MED ORDER — BUPIVACAINE HCL (PF) 0.25 % IJ SOLN
INTRAMUSCULAR | Status: AC
  Filled 2020-09-23: qty 30

## 2020-09-23 MED ORDER — ONDANSETRON HCL 4 MG/2ML IJ SOLN
INTRAMUSCULAR | Status: AC
  Filled 2020-09-23: qty 2

## 2020-09-23 MED ORDER — SUGAMMADEX SODIUM 200 MG/2ML IV SOLN
INTRAVENOUS | Status: DC | PRN
  Administered 2020-09-23: 200 mg via INTRAVENOUS

## 2020-09-23 MED ORDER — ACETAMINOPHEN 325 MG PO TABS: 650.0000 mg | ORAL_TABLET | ORAL | Status: DC | PRN

## 2020-09-23 MED ORDER — POTASSIUM CHLORIDE IN NACL 20-0.9 MEQ/L-% IV SOLN: INTRAVENOUS | Status: DC

## 2020-09-23 MED ORDER — ROCURONIUM BROMIDE 10 MG/ML (PF) SYRINGE
PREFILLED_SYRINGE | INTRAVENOUS | Status: DC | PRN
  Administered 2020-09-23: 100 mg via INTRAVENOUS

## 2020-09-23 MED ORDER — CHLORHEXIDINE GLUCONATE 0.12 % MT SOLN: 15.0000 mL | Freq: Once | OROMUCOSAL | Status: AC

## 2020-09-23 MED ORDER — DEXAMETHASONE 4 MG PO TABS: 4.0000 mg | ORAL_TABLET | Freq: Four times a day (QID) | ORAL | Status: DC

## 2020-09-23 MED ORDER — DEXAMETHASONE SODIUM PHOSPHATE 10 MG/ML IJ SOLN
INTRAMUSCULAR | Status: DC | PRN
  Administered 2020-09-23: 10 mg via INTRAVENOUS

## 2020-09-23 MED ORDER — PROPOFOL 10 MG/ML IV BOLUS
INTRAVENOUS | Status: DC | PRN
  Administered 2020-09-23: 140 mg via INTRAVENOUS

## 2020-09-23 MED ORDER — LOSARTAN POTASSIUM 50 MG PO TABS
100.0000 mg | ORAL_TABLET | Freq: Every day | ORAL | Status: DC
  Administered 2020-09-24: 100 mg via ORAL
  Filled 2020-09-23: qty 2

## 2020-09-23 MED ORDER — SODIUM CHLORIDE 0.9% FLUSH: 3.0000 mL | Freq: Two times a day (BID) | INTRAVENOUS | Status: DC

## 2020-09-23 MED ORDER — PHENOL 1.4 % MT LIQD: 1.0000 | OROMUCOSAL | Status: DC | PRN

## 2020-09-23 MED ORDER — EPHEDRINE 5 MG/ML INJ
INTRAVENOUS | Status: AC
  Filled 2020-09-23: qty 10

## 2020-09-23 MED ORDER — CEFAZOLIN SODIUM-DEXTROSE 2-4 GM/100ML-% IV SOLN
INTRAVENOUS | Status: AC
  Filled 2020-09-23: qty 100

## 2020-09-23 MED ORDER — HYDROCODONE-ACETAMINOPHEN 5-325 MG PO TABS
1.0000 | ORAL_TABLET | ORAL | Status: DC | PRN
Start: 2020-09-23 — End: 2020-09-24
  Administered 2020-09-23 – 2020-09-24 (×3): 1 via ORAL
  Filled 2020-09-23 (×3): qty 1

## 2020-09-23 MED ORDER — CEFAZOLIN SODIUM-DEXTROSE 2-4 GM/100ML-% IV SOLN
2.0000 g | Freq: Three times a day (TID) | INTRAVENOUS | Status: AC
  Administered 2020-09-23 – 2020-09-24 (×2): 2 g via INTRAVENOUS
  Filled 2020-09-23 (×2): qty 100

## 2020-09-23 MED ORDER — DEXAMETHASONE SODIUM PHOSPHATE 4 MG/ML IJ SOLN
4.0000 mg | Freq: Four times a day (QID) | INTRAMUSCULAR | Status: DC
  Administered 2020-09-23 – 2020-09-24 (×2): 4 mg via INTRAVENOUS
  Filled 2020-09-23 (×2): qty 1

## 2020-09-23 MED ORDER — LIDOCAINE 2% (20 MG/ML) 5 ML SYRINGE
INTRAMUSCULAR | Status: AC
  Filled 2020-09-23: qty 5

## 2020-09-23 MED ORDER — ACETAMINOPHEN 500 MG PO TABS
ORAL_TABLET | ORAL | Status: AC
  Administered 2020-09-23: 1000 mg via ORAL
  Filled 2020-09-23: qty 2

## 2020-09-23 MED ORDER — BUPIVACAINE HCL (PF) 0.25 % IJ SOLN
INTRAMUSCULAR | Status: DC | PRN
  Administered 2020-09-23: 3 mL
  Administered 2020-09-23: 6 mL

## 2020-09-23 MED ORDER — CHLORHEXIDINE GLUCONATE CLOTH 2 % EX PADS: 6.0000 | MEDICATED_PAD | Freq: Once | CUTANEOUS | Status: DC

## 2020-09-23 MED ORDER — SODIUM CHLORIDE 0.9 % IV SOLN: 250.0000 mL | INTRAVENOUS | Status: DC

## 2020-09-23 MED ORDER — METHOCARBAMOL 500 MG PO TABS
500.0000 mg | ORAL_TABLET | Freq: Four times a day (QID) | ORAL | Status: DC | PRN
  Administered 2020-09-23 – 2020-09-24 (×2): 500 mg via ORAL
  Filled 2020-09-23 (×3): qty 1

## 2020-09-23 MED ORDER — DEXAMETHASONE SODIUM PHOSPHATE 10 MG/ML IJ SOLN: 10.0000 mg | Freq: Once | INTRAMUSCULAR | Status: DC

## 2020-09-23 MED ORDER — GABAPENTIN 300 MG PO CAPS
ORAL_CAPSULE | ORAL | Status: AC
  Administered 2020-09-23: 300 mg via ORAL
  Filled 2020-09-23: qty 1

## 2020-09-23 MED ORDER — METHOCARBAMOL 1000 MG/10ML IJ SOLN: 500.0000 mg | Freq: Four times a day (QID) | INTRAVENOUS | Status: DC | PRN

## 2020-09-23 MED ORDER — ONDANSETRON HCL 4 MG/2ML IJ SOLN
INTRAMUSCULAR | Status: DC | PRN
  Administered 2020-09-23: 4 mg via INTRAVENOUS

## 2020-09-23 MED ORDER — LIDOCAINE 2% (20 MG/ML) 5 ML SYRINGE
INTRAMUSCULAR | Status: DC | PRN
  Administered 2020-09-23: 100 mg via INTRAVENOUS

## 2020-09-23 MED ORDER — FENTANYL CITRATE (PF) 250 MCG/5ML IJ SOLN
INTRAMUSCULAR | Status: DC | PRN
  Administered 2020-09-23 (×2): 50 ug via INTRAVENOUS
  Administered 2020-09-23: 100 ug via INTRAVENOUS

## 2020-09-23 MED ORDER — THROMBIN (RECOMBINANT) 5000 UNITS EX SOLR
CUTANEOUS | Status: AC
  Filled 2020-09-23: qty 5000

## 2020-09-23 MED ORDER — PHENYLEPHRINE HCL-NACL 10-0.9 MG/250ML-% IV SOLN
INTRAVENOUS | Status: DC | PRN
  Administered 2020-09-23: 25 ug/min via INTRAVENOUS

## 2020-09-23 MED ORDER — SODIUM CHLORIDE 0.9% FLUSH: 3.0000 mL | INTRAVENOUS | Status: DC | PRN

## 2020-09-23 MED ORDER — MENTHOL 3 MG MT LOZG: 1.0000 | LOZENGE | OROMUCOSAL | Status: DC | PRN

## 2020-09-23 MED ORDER — ONDANSETRON HCL 4 MG/2ML IJ SOLN: 4.0000 mg | Freq: Once | INTRAMUSCULAR | Status: DC | PRN

## 2020-09-23 MED ORDER — ONDANSETRON HCL 4 MG PO TABS
4.0000 mg | ORAL_TABLET | Freq: Four times a day (QID) | ORAL | Status: DC | PRN
  Filled 2020-09-23: qty 1

## 2020-09-23 MED ORDER — 0.9 % SODIUM CHLORIDE (POUR BTL) OPTIME
TOPICAL | Status: DC | PRN
  Administered 2020-09-23: 1000 mL

## 2020-09-23 MED ORDER — ACETAMINOPHEN 650 MG RE SUPP: 650.0000 mg | RECTAL | Status: DC | PRN

## 2020-09-23 MED ORDER — LACTATED RINGERS IV SOLN: INTRAVENOUS | Status: DC

## 2020-09-23 MED ORDER — CHLORHEXIDINE GLUCONATE 0.12 % MT SOLN
OROMUCOSAL | Status: AC
  Administered 2020-09-23: 15 mL via OROMUCOSAL
  Filled 2020-09-23: qty 15

## 2020-09-23 SURGICAL SUPPLY — 71 items
ADH SKN CLS APL DERMABOND .7 (GAUZE/BANDAGES/DRESSINGS) ×2
APL SKNCLS STERI-STRIP NONHPOA (GAUZE/BANDAGES/DRESSINGS) ×2
BAND INSRT 18 STRL LF DISP RB (MISCELLANEOUS) ×4
BAND RUBBER #18 3X1/16 STRL (MISCELLANEOUS) ×6 IMPLANT
BASKET BONE COLLECTION (BASKET) ×1 IMPLANT
BENZOIN TINCTURE PRP APPL 2/3 (GAUZE/BANDAGES/DRESSINGS) ×3 IMPLANT
BLADE SURG 15 STRL LF DISP TIS (BLADE) ×2 IMPLANT
BLADE SURG 15 STRL SS (BLADE) ×3
BNDG ELASTIC 4X5.8 VLCR STR LF (GAUZE/BANDAGES/DRESSINGS) ×3 IMPLANT
BNDG GAUZE ELAST 4 BULKY (GAUZE/BANDAGES/DRESSINGS) ×3 IMPLANT
BUR CARBIDE MATCH 3.0 (BURR) ×3 IMPLANT
CABLE BIPOLOR RESECTION CORD (MISCELLANEOUS) ×3 IMPLANT
CAGE CERVICAL 8 (Cage) ×3 IMPLANT
CANISTER SUCT 3000ML PPV (MISCELLANEOUS) ×6 IMPLANT
COVER WAND RF STERILE (DRAPES) ×2 IMPLANT
DERMABOND ADVANCED (GAUZE/BANDAGES/DRESSINGS) ×1
DERMABOND ADVANCED .7 DNX12 (GAUZE/BANDAGES/DRESSINGS) IMPLANT
DIFFUSER DRILL AIR PNEUMATIC (MISCELLANEOUS) ×1 IMPLANT
DRAPE C-ARM 42X72 X-RAY (DRAPES) ×6 IMPLANT
DRAPE EXTREMITY T 121X128X90 (DISPOSABLE) ×3 IMPLANT
DRAPE HALF SHEET 40X57 (DRAPES) ×3 IMPLANT
DRAPE LAPAROTOMY 100X72 PEDS (DRAPES) ×3 IMPLANT
DRAPE MICROSCOPE LEICA (MISCELLANEOUS) ×3 IMPLANT
DRSG EMULSION OIL 3X3 NADH (GAUZE/BANDAGES/DRESSINGS) ×1 IMPLANT
DRSG OPSITE POSTOP 3X4 (GAUZE/BANDAGES/DRESSINGS) ×1 IMPLANT
DURAPREP 26ML APPLICATOR (WOUND CARE) ×3 IMPLANT
DURAPREP 6ML APPLICATOR 50/CS (WOUND CARE) ×3 IMPLANT
ELECT COATED BLADE 2.86 ST (ELECTRODE) ×3 IMPLANT
ELECT REM PT RETURN 9FT ADLT (ELECTROSURGICAL) ×3
ELECTRODE REM PT RTRN 9FT ADLT (ELECTROSURGICAL) ×2 IMPLANT
GAUZE 4X4 16PLY RFD (DISPOSABLE) ×3 IMPLANT
GAUZE SPONGE 4X4 12PLY STRL (GAUZE/BANDAGES/DRESSINGS) ×3 IMPLANT
GLOVE BIO SURGEON STRL SZ7 (GLOVE) IMPLANT
GLOVE BIO SURGEON STRL SZ8 (GLOVE) ×6 IMPLANT
GLOVE SURG UNDER POLY LF SZ7 (GLOVE) IMPLANT
GOWN STRL REUS W/ TWL LRG LVL3 (GOWN DISPOSABLE) IMPLANT
GOWN STRL REUS W/ TWL XL LVL3 (GOWN DISPOSABLE) IMPLANT
GOWN STRL REUS W/TWL 2XL LVL3 (GOWN DISPOSABLE) ×6 IMPLANT
GOWN STRL REUS W/TWL LRG LVL3 (GOWN DISPOSABLE)
GOWN STRL REUS W/TWL XL LVL3 (GOWN DISPOSABLE)
HEMOSTAT POWDER KIT SURGIFOAM (HEMOSTASIS) ×3 IMPLANT
KIT BASIN OR (CUSTOM PROCEDURE TRAY) ×6 IMPLANT
KIT TURNOVER KIT B (KITS) ×6 IMPLANT
NDL HYPO 25X1 1.5 SAFETY (NEEDLE) ×4 IMPLANT
NDL SPNL 20GX3.5 QUINCKE YW (NEEDLE) ×2 IMPLANT
NEEDLE HYPO 25X1 1.5 SAFETY (NEEDLE) ×6 IMPLANT
NEEDLE SPNL 20GX3.5 QUINCKE YW (NEEDLE) ×3 IMPLANT
NS IRRIG 1000ML POUR BTL (IV SOLUTION) ×6 IMPLANT
PACK LAMINECTOMY NEURO (CUSTOM PROCEDURE TRAY) ×3 IMPLANT
PACK SURGICAL SETUP 50X90 (CUSTOM PROCEDURE TRAY) ×3 IMPLANT
PAD ARMBOARD 7.5X6 YLW CONV (MISCELLANEOUS) ×6 IMPLANT
PIN DISTRACTION 14MM (PIN) IMPLANT
PLATE 14MM (Plate) ×1 IMPLANT
PUTTY BONE 1CC ×1 IMPLANT
SCREW CANN 4X16 SS S/DRILL (Screw) ×4 IMPLANT
SPACER SPNL 7D LRG 16X14X8XNS (Cage) IMPLANT
SPCR SPNL 7D LRG 16X14X8XNS (Cage) ×2 IMPLANT
SPONGE INTESTINAL PEANUT (DISPOSABLE) ×3 IMPLANT
SPONGE SURGIFOAM ABS GEL SZ50 (HEMOSTASIS) IMPLANT
STOCKINETTE 4X48 STRL (DRAPES) ×3 IMPLANT
STRIP CLOSURE SKIN 1/2X4 (GAUZE/BANDAGES/DRESSINGS) ×3 IMPLANT
SUT ETHILON 4 0 PS 2 18 (SUTURE) ×3 IMPLANT
SUT VIC AB 3-0 SH 8-18 (SUTURE) ×7 IMPLANT
SUT VICRYL 4-0 PS2 18IN ABS (SUTURE) IMPLANT
SYR BULB EAR ULCER 3OZ GRN STR (SYRINGE) ×3 IMPLANT
SYR CONTROL 10ML LL (SYRINGE) ×3 IMPLANT
TOWEL GREEN STERILE (TOWEL DISPOSABLE) ×6 IMPLANT
TOWEL GREEN STERILE FF (TOWEL DISPOSABLE) ×6 IMPLANT
TUBE CONNECTING 12X1/4 (SUCTIONS) ×3 IMPLANT
UNDERPAD 30X36 HEAVY ABSORB (UNDERPADS AND DIAPERS) ×3 IMPLANT
WATER STERILE IRR 1000ML POUR (IV SOLUTION) ×6 IMPLANT

## 2020-09-23 NOTE — Anesthesia Procedure Notes (Signed)
Procedure Name: Intubation Date/Time: 09/23/2020 3:23 PM Performed by: Kathryne Hitch, CRNA Pre-anesthesia Checklist: Patient identified, Emergency Drugs available, Suction available and Patient being monitored Patient Re-evaluated:Patient Re-evaluated prior to induction Oxygen Delivery Method: Circle system utilized Preoxygenation: Pre-oxygenation with 100% oxygen Induction Type: IV induction Ventilation: Mask ventilation without difficulty Laryngoscope Size: Miller and 3 Grade View: Grade I Tube type: Oral Tube size: 7.5 mm Number of attempts: 1 Airway Equipment and Method: Stylet and Oral airway Placement Confirmation: ETT inserted through vocal cords under direct vision,  positive ETCO2 and breath sounds checked- equal and bilateral Secured at: 22 cm Tube secured with: Tape Dental Injury: Teeth and Oropharynx as per pre-operative assessment

## 2020-09-23 NOTE — H&P (Signed)
Subjective:   Patient is a 80 y.o. male admitted for carpal tunnel syndrome and cervical spinal stenosis. The patient first presented to me with complaints of neck pain and numbness of the arm(s). Onset of symptoms was several months ago. The pain is described as aching and occurs intermittently. The pain is rated moderate, and is located in the neck and radiates to the shoulder. The symptoms have been progressive. Symptoms are exacerbated by extending head backwards, and are relieved by none.  Previous work up includes MRI of cervical spine, results: spinal stenosis.  Nerve conduction studies confirmed right median neuropathy  Past Medical History:  Diagnosis Date  . Carpal tunnel syndrome    right hand  . GERD (gastroesophageal reflux disease)   . History of diverticulitis   . Hypothyroidism   . Left sided ulcerative colitis with rectal bleeding ([place])   . Thyroid cancer ([place]) 1997    Past Surgical History:  Procedure Laterality Date  . CHOLECYSTECTOMY     per patient "maybe about 15 years ago"  . COLONOSCOPY  09/06/2014   Left sided ulcerative colitis up to 55cm. Normal remaining colon. Normal TI. Mild pancolonic diverticulosis. Status post sigmoid resection with patent colo-colic anastomosis.   Marland Kitchen ESOPHAGOGASTRODUODENOSCOPY     Dr Melina Copa. Years ago per patient   . FRACTURE SURGERY  1996 or 1997   right leg per patient   . LOW ANTERIOR BOWEL RESECTION    . MOUTH SURGERY    . ROTATOR CUFF REPAIR    . THYROIDECTOMY    . TONSILLECTOMY     per patient "as a youngster"    No Known Allergies  Social History   Tobacco Use  . Smoking status: Never Smoker  . Smokeless tobacco: Never Used  . Tobacco comment: said he smoked apack in his life as a  teen  Substance Use Topics  . Alcohol use: Not Currently    Family History  Problem Relation Age of Onset  . Colon cancer Neg Hx   . Esophageal cancer Neg Hx    Prior to Admission medications   Medication Sig Start Date End Date Taking?  Authorizing Provider  losartan (COZAAR) 100 MG tablet Take 100 mg by mouth daily. 07/29/20  Yes [provider]  Multiple Vitamins-Minerals (MULTIVITAMIN WITH MINERALS) tablet Take 1 tablet by mouth daily.   Yes [provider]  sulfaSALAzine (AZULFIDINE) 500 MG tablet Take 2 tablets (1,000 mg total) by mouth 2 (two) times daily. 02/22/20  Yes Jackquline Denmark, MD  SYNTHROID 125 MCG tablet Take 125 mcg by mouth daily before breakfast. 08/12/20  Yes [provider]     Review of Systems  Positive ROS: neg  All other systems have been reviewed and were otherwise negative with the exception of those mentioned in the HPI and as above.  Objective: Vital signs in last 24 hours: Temp:  [98.6 F (37 C)] 98.6 F (37 C) (03/11 1040) Pulse Rate:  [74] 74 (03/11 1040) BP: (168)/(85) 168/85 (03/11 1040) SpO2:  [100 %] 100 % (03/11 1040) Weight:  [86.2 kg] 86.2 kg (03/11 1040)  General Appearance: Alert, cooperative, no distress, appears stated age Head: Normocephalic, without obvious abnormality, atraumatic Eyes: PERRL, conjunctiva/corneas clear, EOM's intact      Neck: Supple, symmetrical, trachea midline, Back: Symmetric, no curvature, ROM normal, no CVA tenderness Lungs:  respirations unlabored Heart: Regular rate and rhythm Abdomen: Soft, non-tender Extremities: Extremities normal, atraumatic, no cyanosis or edema Pulses: 2+ and symmetric all extremities Skin: Skin  color, texture, turgor normal, no rashes or lesions  NEUROLOGIC:  Mental status: Alert and oriented x4, no aphasia, good attention span, fund of knowledge and memory  Motor Exam - grossly normal Sensory Exam - grossly normal Reflexes: 1+ Coordination - grossly normal Gait - grossly normal Balance - grossly normal Cranial Nerves: I: smell Not tested  II: visual acuity  OS: nl    OD: nl  II: visual fields Full to confrontation  II: pupils Equal, round, reactive to light  III,VII: ptosis None   III,IV,VI: extraocular muscles  Full ROM  V: mastication Normal  V: facial light touch sensation  Normal  V,VII: corneal reflex  Present  VII: facial muscle function - upper  Normal  VII: facial muscle function - lower Normal  VIII: hearing Not tested  IX: soft palate elevation  Normal  IX,X: gag reflex Present  XI: trapezius strength  5/5  XI: sternocleidomastoid strength 5/5  XI: neck flexion strength  5/5  XII: tongue strength  Normal    Data Review Lab Results  Component Value Date   WBC 9.6 09/21/2020   HGB 11.6 (L) 09/21/2020   HCT 35.4 (L) 09/21/2020   MCV 87.2 09/21/2020   PLT 421 (H) 09/21/2020   Lab Results  Component Value Date   NA 136 09/21/2020   K 4.1 09/21/2020   CL 103 09/21/2020   CO2 25 09/21/2020   BUN 17 09/21/2020   CREATININE 0.87 09/21/2020   GLUCOSE 116 (H) 09/21/2020   Lab Results  Component Value Date   INR 1.1 09/21/2020    Assessment:   Cervical neck pain with herniated nucleus pulposus/ spondylosis/ stenosis at C3-4. EMGs confirmed severe right median neuropathy at the wrist. Estimated body mass index is 27.66 kg/m as calculated from the following:   Height as of this encounter: 5' 9.5" (1.765 m).   Weight as of this encounter: 86.2 kg.  Patient has failed conservative therapy. Planned surgery : ACDF C3-4 and CTR  Plan:   I explained the condition and procedure to the patient and answered any questions.  Patient wishes to proceed with procedure as planned. Understands risks/ benefits/ and expected or typical outcomes.  Eustace Moore 09/23/2020 2:26 PM

## 2020-09-23 NOTE — Op Note (Signed)
09/23/2020  5:14 PM  PATIENT:  Devin [name]  80 y.o. male  PRE-OPERATIVE DIAGNOSIS: 1. Cervical spinal stenosis C3-4, 2.  Right carpal tunnel syndrome  POST-OPERATIVE DIAGNOSIS:  same  PROCEDURE:  1. Decompressive anterior cervical discectomy C3-4, 2. Anterior cervical arthrodesis C3-4 utilizing a peek interbody cage packed with locally harvested morcellized autologous bone graft with DBM, 3. Anterior cervical plating C3-4 utilizing a affect plate. 4.  Right carpal tunnel release  SURGEON:  Sherley Bounds, MD  ASSISTANTS: Glenford Peers FNP  ANESTHESIA:   General  EBL: 50 ml  Total I/O In: 100 [IV Piggyback:100] Out: 16 [Blood:50]  BLOOD ADMINISTERED: none  DRAINS: none  SPECIMEN:  none  INDICATION FOR PROCEDURE: This patient presented with right hand numbness and neck pain shoulder pain. Imaging showed severe cervical spinal stenosis and nerve conduction study showed a right median neuropathy. The patient tried conservative measures without relief. Pain was debilitating. Recommended ACDF with plating. Patient understood the risks, benefits, and alternatives and potential outcomes and wished to proceed.  PROCEDURE DETAILS: Patient was brought to the operating room placed under general endotracheal anesthesia. Patient was placed in the supine position on the operating room table.  We started with the carpal tunnel release.  The right hand was prepped from the fingertips to the elbow with DuraPrep and then draped in the usual sterile fashion 60 cc of local anesthesia was injected and a small palmar incision was made from the distal wrist crease into the palm.  A self-retaining retractor was placed.  The palmar fascia was opened to expose the underlying transverse carpal ligament.  The carpal ligament was then opened with a 15 blade scalpel to expose the underlying median nerve.  We then spread between the nerve and the ligament both proximally and distally and completely  transected the ligament until the nerve was free.  We then palpated both proximally and distally with a hemostat.  We irrigated with saline solution.  We dried all bleeding points.  We closed the palmar fascia in the subcutaneous tissues with 3-0 Vicryl.  We closed the skin with interrupted 4-0 Ethilon vertical mattress sutures.  The hand was then wrapped in a Kerlix and Ace bandage we turned our attention to the ACDF.   The neck was prepped with Duraprep and draped in a sterile fashion.   Three cc of local anesthesia was injected and a transverse incision was made on the right side of the neck.  Dissection was carried down thru the subcutaneous tissue and the platysma was  elevated, opened, and undermined with Metzenbaum scissors.  Dissection was then carried out thru an avascular plane leaving the sternocleidomastoid carotid artery and jugular vein laterally and the trachea and esophagus medially with the assistance of my nurse practitioner. The ventral aspect of the vertebral column was identified and a localizing x-ray was taken. The C3-4 level was identified and all in the room agreed with the level. The longus colli muscles were then elevated and the retractor was placed with the assistance of my nurse practitioner. The annulus was incised and the disc space entered. Discectomy was performed with micro-curettes and pituitary rongeurs. I then used the high-speed drill to drill the endplates down to the level of the posterior longitudinal ligament. The drill shavings were saved in a mucous trap for later arthrodesis. The operating microscope was draped and brought into the field provided additional magnification, illumination and visualization. Discectomy was continued posteriorly thru the disc space. Posterior longitudinal ligament was opened with  a nerve hook, and then removed along with disc herniation and osteophytes, decompressing the spinal canal and thecal sac. We then continued to remove osteophytic  overgrowth and disc material decompressing the neural foramina and exiting nerve roots bilaterally. The scope was angled up and down to help decompress and undercut the vertebral bodies. Once the decompression was completed we could pass a nerve hook circumferentially to assure adequate decompression in the midline and in the neural foramina. So by both visualization and palpation we felt we had an adequate decompression of the neural elements. We then measured the height of the intravertebral disc space and selected a 8 millimeter peek interbody cage packed with autograft and DBM. It was then gently positioned in the intravertebral disc space(s) and countersunk. I then used a 14 mm Alphatec plate and placed variable angle screws into the vertebral bodies of each level and locked them into position. The wound was irrigated with bacitracin solution, checked for hemostasis which was established and confirmed. Once meticulous hemostasis was achieved, we then proceeded with closure with the assistance of my nurse practitioner. The platysma was closed with interrupted 3-0 undyed Vicryl suture, the subcuticular layer was closed with interrupted 3-0 undyed Vicryl suture. The skin edges were approximated with steristrips. The drapes were removed. A sterile dressing was applied. The patient was then awakened from general anesthesia and transferred to the recovery room in stable condition. At the end of the procedure all sponge, needle and instrument counts were correct.   PLAN OF CARE: Admit for overnight observation  PATIENT DISPOSITION:  PACU - hemodynamically stable.   Delay start of Pharmacological VTE agent (>24hrs) due to surgical blood loss or risk of bleeding:  yes

## 2020-09-23 NOTE — Transfer of Care (Signed)
Immediate Anesthesia Transfer of Care Note  Patient: Devin [name]  Procedure(s) Performed: Anterior Cervical Discectomy Fusion - Cervical Three-Cervical Four (N/A ) Right carpal tunnel release (Right )  Patient Location: PACU  Anesthesia Type:General  Level of Consciousness: drowsy and patient cooperative  Airway & Oxygen Therapy: Patient Spontanous Breathing and Patient connected to nasal cannula oxygen  Post-op Assessment: Report given to RN, Post -op Vital signs reviewed and stable and Patient moving all extremities  Post vital signs: Reviewed and stable  Last Vitals:  Vitals Value Taken Time  BP 157/65 09/23/20 1721  Temp    Pulse 72 09/23/20 1721  Resp 16 09/23/20 1721  SpO2 100 % 09/23/20 1721  Vitals shown include unvalidated device data.  Last Pain:  Vitals:   09/23/20 1040  TempSrc: Temporal  PainSc:          Complications: No complications documented.

## 2020-09-24 DIAGNOSIS — G5601 Carpal tunnel syndrome, right upper limb: Secondary | ICD-10-CM | POA: Diagnosis not present

## 2020-09-24 DIAGNOSIS — Z85850 Personal history of malignant neoplasm of thyroid: Secondary | ICD-10-CM | POA: Diagnosis not present

## 2020-09-24 DIAGNOSIS — Z79899 Other long term (current) drug therapy: Secondary | ICD-10-CM | POA: Diagnosis not present

## 2020-09-24 DIAGNOSIS — M4802 Spinal stenosis, cervical region: Secondary | ICD-10-CM | POA: Diagnosis not present

## 2020-09-24 DIAGNOSIS — K219 Gastro-esophageal reflux disease without esophagitis: Secondary | ICD-10-CM | POA: Diagnosis not present

## 2020-09-24 DIAGNOSIS — E039 Hypothyroidism, unspecified: Secondary | ICD-10-CM | POA: Diagnosis not present

## 2020-09-24 MED ORDER — HYDROCODONE-ACETAMINOPHEN 5-325 MG PO TABS
1.0000 | ORAL_TABLET | ORAL | 0 refills | Status: DC | PRN
Start: 2020-09-24 — End: 2021-01-10

## 2020-09-24 NOTE — Anesthesia Postprocedure Evaluation (Signed)
Anesthesia Post Note  Patient: Devin [name]  Procedure(s) Performed: Anterior Cervical Discectomy Fusion - Cervical Three-Cervical Four (N/A ) Right carpal tunnel release (Right )     Patient location during evaluation: PACU Anesthesia Type: General Level of consciousness: sedated Pain management: pain level controlled Vital Signs Assessment: post-procedure vital signs reviewed and stable Respiratory status: spontaneous breathing and respiratory function stable Cardiovascular status: stable Postop Assessment: no apparent nausea or vomiting Anesthetic complications: no   No complications documented.                 Vikas Wegmann DANIEL

## 2020-09-24 NOTE — Progress Notes (Addendum)
Neurosurgery Service Progress Note  Subjective: No acute events overnight, some odynophagia, no dysphagia, preop radicular pain resolved, improved numbness in R 1st 3 digits  Objective: Vitals:   09/23/20 2037 09/24/20 0030 09/24/20 0357 09/24/20 0744  BP: (!) 145/75 (!) 168/82 (!) 160/83 (!) 147/50  Pulse: 82 73 76 82  Resp: 18 20 18 20   Temp: 97.6 F (36.4 C) (!) 97.5 F (36.4 C)  98.2 F (36.8 C)  TempSrc: Oral Oral  Oral  SpO2: 100% 100% 100% 100%  Weight:      Height:        Physical Exam: Strength 5/5x4 but pain limited in R hand, SILT, incisions c/d/i, voice w/o hoarseness  Assessment & Plan: 80 y.o. man s/p ACDF and R CTR, recovering well.  -discharge home today  Judith Part  09/24/20 8:23 AM

## 2020-09-24 NOTE — Evaluation (Signed)
Occupational Therapy Evaluation Patient Details Name: Devin [name] MRN: [id number] DOB: 1941-01-26 Today's Date: 09/24/2020    History of Present Illness Pt is a 80 yo male s/p C3-4 ACD and R carpal tunnel release. PMHx: rotator cuff repair, thyroid CA.   Clinical Impression   Pt PTA: Pt full time farmer and stays active, 1L home and spouse at home all the time. Pt currently, modified independent with mobility. Pt education on back precautions and need for AE or performing figure 4 technique. Pt supervisionA to minA for LB dressing and pt reports that his spouse can assist at home. Pt has shoe horn to use at home.  Pt ambulating with modified independence and able to manage 5 steps.  Neck handout provided and reviewed adls in detail. Pt educated on: set an alarm at night for medication, avoid sitting for long periods of time, correct bed positioning for sleeping, correct sequence for bed mobility, avoiding lifting more than 5 pounds and never wash directly over incision. All education is complete and patient indicates understanding. Pt does not require continued OT skilled services as pt at his functional baseline. OT signing off, thank you.    Follow Up Recommendations  No OT follow up;Supervision - Intermittent    Equipment Recommendations  None recommended by OT    Recommendations for Other Services       Precautions / Restrictions Precautions Precautions: Cervical Precaution Comments: discussion of 3/3 cervical precautions and handout provided Required Braces or Orthoses: Cervical Brace Cervical Brace: Soft collar;For comfort Restrictions Weight Bearing Restrictions: No Other Position/Activity Restrictions: R carpal runnel release      Mobility Bed Mobility Overal bed mobility: Independent             General bed mobility comments: log roll technique utilized    Transfers Overall transfer level: Modified independent Equipment used: None                   Balance Overall balance assessment: Modified Independent                                         ADL either performed or assessed with clinical judgement   ADL Overall ADL's : Modified independent                                       General ADL Comments: Pt education on back precautions and need for AE or performing figure 4 technique. Pt supervisionA to minA for LB dressing and pt reports that his spouse can assist at home. Pt has shoe horn to use at home. Pt stood at sink for grooming tasks.     Vision Baseline Vision/History: No visual deficits Patient Visual Report: No change from baseline Vision Assessment?: No apparent visual deficits     Perception     Praxis      Pertinent Vitals/Pain Pain Assessment: 0-10 Pain Score: 2  Pain Location: R hand Pain Descriptors / Indicators: Tingling Pain Intervention(s): Monitored during session;Repositioned     Hand Dominance Right   Extremity/Trunk Assessment Upper Extremity Assessment Upper Extremity Assessment: RUE deficits/detail RUE Deficits / Details: s/p CTR; AROM in digits, WFLs and mild swelling RUE: Unable to fully assess due to immobilization RUE Sensation: decreased light touch RUE Coordination: decreased fine motor   Lower Extremity  Assessment Lower Extremity Assessment: Overall WFL for tasks assessed   Cervical / Trunk Assessment Cervical / Trunk Assessment: Normal   Communication Communication Communication: No difficulties   Cognition Arousal/Alertness: Awake/alert Behavior During Therapy: WFL for tasks assessed/performed Overall Cognitive Status: Within Functional Limits for tasks assessed                                     General Comments  VSS. pt ambulating >200' with no physical assist or LOB. Pt stait training x5 steps without difficulty.    Exercises Exercises: Other exercises Other Exercises Other Exercises: AROM of digits    Shoulder Instructions      Home Living Family/patient expects to be discharged to:: Private residence Living Arrangements: Spouse/significant other Available Help at Discharge: Family;Available 24 hours/day Type of Home: House Home Access: Stairs to enter [employer] of Steps: 2 Entrance Stairs-Rails: Can reach both Home Layout: One level     Bathroom Shower/Tub: Door;Walk-in Psychologist, prison and probation services: Standard     Home Equipment: None          Prior Functioning/Environment Level of Independence: Independent        Comments: farmer        OT Problem List:        OT Treatment/Interventions:      OT Goals(Current goals can be found in the care plan section) Acute Rehab OT Goals Patient Stated Goal: to go home OT Goal Formulation: All assessment and education complete, DC therapy Potential to Achieve Goals: Good  OT Frequency:     Barriers to D/C:            Co-evaluation              AM-PAC OT "6 Clicks" Daily Activity     Outcome Measure Help from another person eating meals?: None Help from another person taking care of personal grooming?: None Help from another person toileting, which includes using toliet, bedpan, or urinal?: None Help from another person bathing (including washing, rinsing, drying)?: None Help from another person to put on and taking off regular upper body clothing?: None Help from another person to put on and taking off regular lower body clothing?: A Little 6 Click Score: 23   End of Session Equipment Utilized During Treatment: Cervical collar Nurse Communication: Mobility status  Activity Tolerance: Patient tolerated treatment well Patient left: in chair;with call bell/phone within reach  OT Visit Diagnosis: Unsteadiness on feet (R26.81)                Time: 1638-4536 OT Time Calculation (min): 40 min Charges:  OT General Charges $OT Visit: 1 Visit OT Evaluation $OT Eval Moderate Complexity: 1 Mod OT  Treatments $Self Care/Home Management : 8-22 mins Jefferey Pica, OTR/L Acute Rehabilitation Services Pager: [phone] Office: [phone]   Andy Moye C 09/24/2020, 9:18 AM

## 2020-09-24 NOTE — Discharge Summary (Signed)
Discharge Summary  Date of Admission: 09/23/2020  Date of Discharge: 09/24/20  Attending Physician: Emelda Brothers, MD  Hospital Course: Patient was admitted following an uncomplicated H7-4 ACDF and R CTR. He was recovered in PACU and transferred to [facility]. His preop symptoms were resolved post-op, his hospital course was uncomplicated and the patient was discharged home on POD1. He will follow up in clinic with Dr. Ronnald Ramp in 2 weeks.  Neurologic exam at discharge:  Strength 5/5 x4 except pain limited in the R hand, SILTx4  Discharge diagnosis: Cervical radiculopathy, carpal tunnel syndrome  Judith Part, MD 09/24/20 8:26 AM

## 2020-09-24 NOTE — Discharge Instructions (Signed)
Wound Care You may shower Keep incision area dry.  You may remove outer bandage after 2 days   Do not put any creams, lotions, or ointments on incision. Leave steri-strips on neck.  They will fall off by themselves. Activity Walk each and every day, increasing distance each day. No lifting greater than 5 lbs.  Avoid excessive neck motion. No driving for 2 weeks; may ride as a passenger locally. Wear neck brace at all times except when showering. Diet Resume your normal diet.  Return to Work Will be discussed at you follow up appointment. Call Your Doctor If Any of These Occur Redness, drainage, or swelling at the wound.  Temperature greater than 101 degrees. Severe pain not relieved by pain medication. Increased difficulty swallowing.  Incision starts to come apart. Follow Up Appt Call  [phone])  for problems.  If you have any hardware placed in your spine, you will need an x-ray before your appointment.

## 2020-09-24 NOTE — Progress Notes (Signed)
Patient alert and oriented, voiding adequately, MAE well with no difficulty. Incision area cdi with no s/s of infection. Patient discharged home per order. Patient and spouse stated understanding of discharge instructions given. Patient has an appointment with Dr. Ronnald Ramp in 2 weeks

## 2020-09-26 MED FILL — Thrombin (Recombinant) For Soln 5000 Unit: CUTANEOUS | Qty: 5000 | Status: AC

## 2020-09-28 ENCOUNTER — Encounter (HOSPITAL_COMMUNITY): Payer: Self-pay | Admitting: Neurological Surgery

## 2020-10-05 DIAGNOSIS — M4802 Spinal stenosis, cervical region: Secondary | ICD-10-CM | POA: Diagnosis not present

## 2020-10-05 DIAGNOSIS — I10 Essential (primary) hypertension: Secondary | ICD-10-CM | POA: Diagnosis not present

## 2020-10-05 DIAGNOSIS — Z6829 Body mass index (BMI) 29.0-29.9, adult: Secondary | ICD-10-CM | POA: Diagnosis not present

## 2020-11-03 DIAGNOSIS — M25641 Stiffness of right hand, not elsewhere classified: Secondary | ICD-10-CM | POA: Diagnosis not present

## 2020-11-03 DIAGNOSIS — M4802 Spinal stenosis, cervical region: Secondary | ICD-10-CM | POA: Diagnosis not present

## 2020-11-03 DIAGNOSIS — M6281 Muscle weakness (generalized): Secondary | ICD-10-CM | POA: Diagnosis not present

## 2020-11-03 DIAGNOSIS — G5601 Carpal tunnel syndrome, right upper limb: Secondary | ICD-10-CM | POA: Diagnosis not present

## 2020-11-03 DIAGNOSIS — G542 Cervical root disorders, not elsewhere classified: Secondary | ICD-10-CM | POA: Diagnosis not present

## 2020-11-03 DIAGNOSIS — M25441 Effusion, right hand: Secondary | ICD-10-CM | POA: Diagnosis not present

## 2020-11-14 DIAGNOSIS — M25641 Stiffness of right hand, not elsewhere classified: Secondary | ICD-10-CM | POA: Diagnosis not present

## 2020-11-14 DIAGNOSIS — M6281 Muscle weakness (generalized): Secondary | ICD-10-CM | POA: Diagnosis not present

## 2020-11-14 DIAGNOSIS — G542 Cervical root disorders, not elsewhere classified: Secondary | ICD-10-CM | POA: Diagnosis not present

## 2020-11-14 DIAGNOSIS — M25441 Effusion, right hand: Secondary | ICD-10-CM | POA: Diagnosis not present

## 2020-11-14 DIAGNOSIS — G5601 Carpal tunnel syndrome, right upper limb: Secondary | ICD-10-CM | POA: Diagnosis not present

## 2020-11-15 ENCOUNTER — Other Ambulatory Visit: Payer: Self-pay | Admitting: Oncology

## 2020-11-17 DIAGNOSIS — M6281 Muscle weakness (generalized): Secondary | ICD-10-CM | POA: Diagnosis not present

## 2020-11-17 DIAGNOSIS — M25441 Effusion, right hand: Secondary | ICD-10-CM | POA: Diagnosis not present

## 2020-11-17 DIAGNOSIS — M25641 Stiffness of right hand, not elsewhere classified: Secondary | ICD-10-CM | POA: Diagnosis not present

## 2020-11-17 DIAGNOSIS — G5601 Carpal tunnel syndrome, right upper limb: Secondary | ICD-10-CM | POA: Diagnosis not present

## 2020-11-17 DIAGNOSIS — G542 Cervical root disorders, not elsewhere classified: Secondary | ICD-10-CM | POA: Diagnosis not present

## 2020-11-23 DIAGNOSIS — M25441 Effusion, right hand: Secondary | ICD-10-CM | POA: Diagnosis not present

## 2020-11-23 DIAGNOSIS — M25641 Stiffness of right hand, not elsewhere classified: Secondary | ICD-10-CM | POA: Diagnosis not present

## 2020-11-23 DIAGNOSIS — G5601 Carpal tunnel syndrome, right upper limb: Secondary | ICD-10-CM | POA: Diagnosis not present

## 2020-11-23 DIAGNOSIS — M6281 Muscle weakness (generalized): Secondary | ICD-10-CM | POA: Diagnosis not present

## 2020-11-23 DIAGNOSIS — G542 Cervical root disorders, not elsewhere classified: Secondary | ICD-10-CM | POA: Diagnosis not present

## 2020-11-25 DIAGNOSIS — G5601 Carpal tunnel syndrome, right upper limb: Secondary | ICD-10-CM | POA: Diagnosis not present

## 2020-11-25 DIAGNOSIS — M25641 Stiffness of right hand, not elsewhere classified: Secondary | ICD-10-CM | POA: Diagnosis not present

## 2020-11-25 DIAGNOSIS — M25441 Effusion, right hand: Secondary | ICD-10-CM | POA: Diagnosis not present

## 2020-11-25 DIAGNOSIS — G542 Cervical root disorders, not elsewhere classified: Secondary | ICD-10-CM | POA: Diagnosis not present

## 2020-11-25 DIAGNOSIS — M6281 Muscle weakness (generalized): Secondary | ICD-10-CM | POA: Diagnosis not present

## 2020-11-28 DIAGNOSIS — E039 Hypothyroidism, unspecified: Secondary | ICD-10-CM | POA: Diagnosis not present

## 2020-11-28 DIAGNOSIS — K219 Gastro-esophageal reflux disease without esophagitis: Secondary | ICD-10-CM | POA: Diagnosis not present

## 2020-11-28 DIAGNOSIS — G5612 Other lesions of median nerve, left upper limb: Secondary | ICD-10-CM | POA: Diagnosis not present

## 2020-11-28 DIAGNOSIS — I10 Essential (primary) hypertension: Secondary | ICD-10-CM | POA: Diagnosis not present

## 2020-11-28 DIAGNOSIS — Z79899 Other long term (current) drug therapy: Secondary | ICD-10-CM | POA: Diagnosis not present

## 2020-11-28 DIAGNOSIS — Z87891 Personal history of nicotine dependence: Secondary | ICD-10-CM | POA: Diagnosis not present

## 2020-11-29 DIAGNOSIS — G5602 Carpal tunnel syndrome, left upper limb: Secondary | ICD-10-CM | POA: Diagnosis not present

## 2020-11-30 DIAGNOSIS — G542 Cervical root disorders, not elsewhere classified: Secondary | ICD-10-CM | POA: Diagnosis not present

## 2020-11-30 DIAGNOSIS — M25641 Stiffness of right hand, not elsewhere classified: Secondary | ICD-10-CM | POA: Diagnosis not present

## 2020-11-30 DIAGNOSIS — M6281 Muscle weakness (generalized): Secondary | ICD-10-CM | POA: Diagnosis not present

## 2020-11-30 DIAGNOSIS — M25441 Effusion, right hand: Secondary | ICD-10-CM | POA: Diagnosis not present

## 2020-11-30 DIAGNOSIS — G5601 Carpal tunnel syndrome, right upper limb: Secondary | ICD-10-CM | POA: Diagnosis not present

## 2020-12-05 DIAGNOSIS — G5601 Carpal tunnel syndrome, right upper limb: Secondary | ICD-10-CM | POA: Diagnosis not present

## 2020-12-05 DIAGNOSIS — M25641 Stiffness of right hand, not elsewhere classified: Secondary | ICD-10-CM | POA: Diagnosis not present

## 2020-12-05 DIAGNOSIS — M6281 Muscle weakness (generalized): Secondary | ICD-10-CM | POA: Diagnosis not present

## 2020-12-05 DIAGNOSIS — M25441 Effusion, right hand: Secondary | ICD-10-CM | POA: Diagnosis not present

## 2020-12-05 DIAGNOSIS — G542 Cervical root disorders, not elsewhere classified: Secondary | ICD-10-CM | POA: Diagnosis not present

## 2020-12-07 DIAGNOSIS — H43813 Vitreous degeneration, bilateral: Secondary | ICD-10-CM | POA: Diagnosis not present

## 2020-12-07 DIAGNOSIS — G5601 Carpal tunnel syndrome, right upper limb: Secondary | ICD-10-CM | POA: Diagnosis not present

## 2020-12-07 DIAGNOSIS — M6281 Muscle weakness (generalized): Secondary | ICD-10-CM | POA: Diagnosis not present

## 2020-12-07 DIAGNOSIS — M25441 Effusion, right hand: Secondary | ICD-10-CM | POA: Diagnosis not present

## 2020-12-07 DIAGNOSIS — M25641 Stiffness of right hand, not elsewhere classified: Secondary | ICD-10-CM | POA: Diagnosis not present

## 2020-12-07 DIAGNOSIS — G542 Cervical root disorders, not elsewhere classified: Secondary | ICD-10-CM | POA: Diagnosis not present

## 2020-12-08 ENCOUNTER — Other Ambulatory Visit: Payer: Self-pay | Admitting: Gastroenterology

## 2020-12-14 DIAGNOSIS — M25441 Effusion, right hand: Secondary | ICD-10-CM | POA: Diagnosis not present

## 2020-12-14 DIAGNOSIS — G5601 Carpal tunnel syndrome, right upper limb: Secondary | ICD-10-CM | POA: Diagnosis not present

## 2020-12-14 DIAGNOSIS — M6281 Muscle weakness (generalized): Secondary | ICD-10-CM | POA: Diagnosis not present

## 2020-12-14 DIAGNOSIS — G542 Cervical root disorders, not elsewhere classified: Secondary | ICD-10-CM | POA: Diagnosis not present

## 2020-12-14 DIAGNOSIS — M25641 Stiffness of right hand, not elsewhere classified: Secondary | ICD-10-CM | POA: Diagnosis not present

## 2020-12-16 DIAGNOSIS — G542 Cervical root disorders, not elsewhere classified: Secondary | ICD-10-CM | POA: Diagnosis not present

## 2020-12-16 DIAGNOSIS — M25641 Stiffness of right hand, not elsewhere classified: Secondary | ICD-10-CM | POA: Diagnosis not present

## 2020-12-16 DIAGNOSIS — M6281 Muscle weakness (generalized): Secondary | ICD-10-CM | POA: Diagnosis not present

## 2020-12-16 DIAGNOSIS — G5601 Carpal tunnel syndrome, right upper limb: Secondary | ICD-10-CM | POA: Diagnosis not present

## 2020-12-16 DIAGNOSIS — M25441 Effusion, right hand: Secondary | ICD-10-CM | POA: Diagnosis not present

## 2020-12-20 DIAGNOSIS — M25641 Stiffness of right hand, not elsewhere classified: Secondary | ICD-10-CM | POA: Diagnosis not present

## 2020-12-20 DIAGNOSIS — M6281 Muscle weakness (generalized): Secondary | ICD-10-CM | POA: Diagnosis not present

## 2020-12-20 DIAGNOSIS — G5601 Carpal tunnel syndrome, right upper limb: Secondary | ICD-10-CM | POA: Diagnosis not present

## 2020-12-20 DIAGNOSIS — M25441 Effusion, right hand: Secondary | ICD-10-CM | POA: Diagnosis not present

## 2020-12-20 DIAGNOSIS — G542 Cervical root disorders, not elsewhere classified: Secondary | ICD-10-CM | POA: Diagnosis not present

## 2020-12-22 DIAGNOSIS — M6281 Muscle weakness (generalized): Secondary | ICD-10-CM | POA: Diagnosis not present

## 2020-12-22 DIAGNOSIS — M25641 Stiffness of right hand, not elsewhere classified: Secondary | ICD-10-CM | POA: Diagnosis not present

## 2020-12-22 DIAGNOSIS — M25441 Effusion, right hand: Secondary | ICD-10-CM | POA: Diagnosis not present

## 2020-12-22 DIAGNOSIS — G5601 Carpal tunnel syndrome, right upper limb: Secondary | ICD-10-CM | POA: Diagnosis not present

## 2020-12-22 DIAGNOSIS — G542 Cervical root disorders, not elsewhere classified: Secondary | ICD-10-CM | POA: Diagnosis not present

## 2021-01-10 ENCOUNTER — Other Ambulatory Visit: Payer: Self-pay

## 2021-01-10 ENCOUNTER — Ambulatory Visit (INDEPENDENT_AMBULATORY_CARE_PROVIDER_SITE_OTHER): Payer: Medicare Other | Admitting: Gastroenterology

## 2021-01-10 ENCOUNTER — Encounter: Payer: Self-pay | Admitting: Gastroenterology

## 2021-01-10 VITALS — BP 158/78 | HR 64 | Ht 69.5 in | Wt 201.5 lb

## 2021-01-10 DIAGNOSIS — Z8719 Personal history of other diseases of the digestive system: Secondary | ICD-10-CM | POA: Diagnosis not present

## 2021-01-10 DIAGNOSIS — K51919 Ulcerative colitis, unspecified with unspecified complications: Secondary | ICD-10-CM | POA: Diagnosis not present

## 2021-01-10 MED ORDER — SULFASALAZINE 500 MG PO TABS: 1000.0000 mg | ORAL_TABLET | Freq: Two times a day (BID) | ORAL | 4 refills | Status: DC

## 2021-01-10 NOTE — Patient Instructions (Addendum)
If you are age 80 or older, your body mass index should be between 23-30. Your Body mass index is 29.33 kg/m. If this is out of the aforementioned range listed, please consider follow up with your Primary Care Provider.  If you are age 30 or younger, your body mass index should be between 19-25. Your Body mass index is 29.33 kg/m. If this is out of the aformentioned range listed, please consider follow up with your Primary Care Provider.   __________________________________________________________  The [place] [facility] providers would like to encourage you to use [facility] to communicate with providers for non-urgent requests or questions.  Due to long hold times on the telephone, sending your provider a message by [facility] may be a faster and more efficient way to get a response.  Please allow 48 business hours for a response.  Please remember that this is for non-urgent requests.   We have sent the following medications to your pharmacy for you to pick up at your convenience: Sulfasalazine   Follow up in one year. Please call to schedule you can ask to speak to [facility] if you need to.  Thank you,  Dr. Jackquline Denmark

## 2021-01-10 NOTE — Progress Notes (Signed)
Chief Complaint: FU  Referring Provider: Dr Micheal Likens      ASSESSMENT AND PLAN;   #1.  Left-sided ulcerative colitis Dx 2011.  #2.  H/O Diverticulitis on CT History of diverticulitis s/p LAR 1/98.  H/O recent diverticulitis 2020.  Plan: - Sulfasalazine 500 mg/tab. 2 tablets p.o. twice daily #360, 4 refills.  Discussed S/Es including photosensitivity. - Folic 53m po qd. - colace 1 tab po qd before carpel tunnel Sx7/15 - FU in 1 year  HPI:    Devin DOMEis a 80y.o. male  For medication refill.  Doing well.  Awaiting left hand carpal tunnel surgery  No nausea, vomiting, heartburn, regurgitation, odynophagia or dysphagia.  No significant diarrhea or constipation.  No melena or hematochezia. No unintentional weight loss. No abdominal pain.  Underwent CT Abdo/pelvis which showed acute diverticulitis.  He was treated with Cipro and Flagyl with good results.  Had been seen by Dr. EAmalia Hailey Previous GI procedures: -Colonoscopy 07/2018, minimal smudged vascular pattern up to 55 cm.  No active colitis.  Pancolonic diverticulosis.  Bx- neg. small tubular adenoma s/p polypectomy.  08/2014: Left-sided ulcerative colitis up to 55 cm, normal remaining colon, normal TI.  Mild pancolonic diverticulosis, status post sigmoid resection with patent anastomosis. - CT AP 05/2019: No acute findings.  Postoperative findings of sigmoid colon resection and anastomosis.  Occasional diverticula.  Also noted is a diverticulum in the descending duodenum.  Additional social history: Married to PBrenas self-employed, owns [place] Daughter LCordelia Penand son TKonrad DoloresPast Medical History:  Diagnosis Date   GERD (gastroesophageal reflux disease)    History of diverticulitis    Hypothyroidism    Left sided ulcerative colitis with rectal bleeding ([facility]     Past Surgical History:  Procedure Laterality Date   COLONOSCOPY  09/06/2014   Left sided ulcerative colitis up to 55cm. Normal remaining  colon. Normal TI. Mild pancolonic diverticulosis. Status post sigmoid resection with patent colo-colic anastomosis.    ESOPHAGOGASTRODUODENOSCOPY     Dr BMelina Copa Years ago per patient    LOW ANTERIOR BOWEL RESECTION     MOUTH SURGERY     ROTATOR CUFF REPAIR     THYROIDECTOMY      Family History  Problem Relation Age of Onset   Colon cancer Neg Hx    Esophageal cancer Neg Hx     Social History   Tobacco Use   Smoking status: Never Smoker   Smokeless tobacco: Never Used   Tobacco comment: said he smoked apack in his life as a  teen  Substance Use Topics   Alcohol use: Not Currently   Drug use: Never    Current Outpatient Medications  Medication Sig Dispense Refill   clonazePAM (KLONOPIN) 0.5 MG tablet Take 0.5 mg by mouth at bedtime as needed. for sleep     levothyroxine (SYNTHROID) 137 MCG tablet Take by mouth daily.     losartan (COZAAR) 50 MG tablet Take 50 mg by mouth daily.  12   sulfaSALAzine (AZULFIDINE) 500 MG tablet Take 2 tablets (1,000 mg total) by mouth 2 (two) times daily. 120 tablet 11   No current facility-administered medications for this visit.     Not on File  Review of Systems:  neg     Physical Exam:    Ht 5' 9.5" (1.765 m)   Wt 193 lb (87.5 kg)   BMI 28.09 kg/m  Filed Weights   06/15/19 1118  Weight: 193 lb (87.5 kg)  Constitutional:  Well-developed, in no acute distress. Psychiatric: Normal mood and affect. Behavior is normal. Not examined since it was a televisit. This service was provided via telemedicine.  The patient was located at home.  The provider was located in office.  The patient did consent to this telephone visit and is aware of possible charges through their insurance for this visit.  The patient was referred by Dr. Amalia Hailey.  The other persons participating in this telemedicine service were [name]  (patient's wife ) and their role was coordination of care.  Time spent on call: 15 min   Carmell Austria, MD 06/15/2019, 11:32  AM  Cc: Dr. Micheal Likens

## 2021-01-13 HISTORY — PX: OTHER SURGICAL HISTORY: SHX169

## 2021-01-24 DIAGNOSIS — K529 Noninfective gastroenteritis and colitis, unspecified: Secondary | ICD-10-CM | POA: Diagnosis not present

## 2021-01-24 DIAGNOSIS — G4700 Insomnia, unspecified: Secondary | ICD-10-CM | POA: Diagnosis not present

## 2021-01-24 DIAGNOSIS — Z125 Encounter for screening for malignant neoplasm of prostate: Secondary | ICD-10-CM | POA: Diagnosis not present

## 2021-01-24 DIAGNOSIS — I723 Aneurysm of iliac artery: Secondary | ICD-10-CM | POA: Diagnosis not present

## 2021-01-24 DIAGNOSIS — E063 Autoimmune thyroiditis: Secondary | ICD-10-CM | POA: Diagnosis not present

## 2021-01-24 DIAGNOSIS — Z79899 Other long term (current) drug therapy: Secondary | ICD-10-CM | POA: Diagnosis not present

## 2021-01-24 DIAGNOSIS — D649 Anemia, unspecified: Secondary | ICD-10-CM | POA: Diagnosis not present

## 2021-01-24 DIAGNOSIS — I10 Essential (primary) hypertension: Secondary | ICD-10-CM | POA: Diagnosis not present

## 2021-01-24 DIAGNOSIS — Z85850 Personal history of malignant neoplasm of thyroid: Secondary | ICD-10-CM | POA: Diagnosis not present

## 2021-01-24 DIAGNOSIS — R739 Hyperglycemia, unspecified: Secondary | ICD-10-CM | POA: Diagnosis not present

## 2021-01-24 LAB — IRON,TIBC AND FERRITIN PANEL
%SAT: 12
Ferritin: 109
Iron: 34
TIBC: 273
UIBC: 239

## 2021-01-24 LAB — HEMOGLOBIN A1C: Hemoglobin A1C: 6.2

## 2021-01-24 NOTE — Progress Notes (Signed)
[place]  [address] [place],  [place]  [address] [phone]  Clinic Day:  01/25/2021  Referring physician: Ocie Doyne., MD  This document serves as a record of services personally performed by Hosie Poisson, MD. It was created on their behalf by [facility], a trained medical scribe. The creation of this record is based on the scribe's personal observations and the provider's statements to them.  CHIEF COMPLAINT:  CC: History of thyroid cancer  Current Treatment:  Surveillance  HISTORY OF PRESENT ILLNESS:  Devin [name] is a 80 y.o. male with a history of thyroid cancer diagnosed in 1997.  He presented with fever and a palpable mass.  He underwent total thyroidectomy, as well as a right radical neck dissection, followed by radioactive iodine.  This was done at [facility] and he transferred his care here in August 2015 and has remained without evidence of disease.  Thyroid ultrasound in July 2016 revealed normal post thyroidectomy appearance, with no evidence of recurrence.  Chest x-ray in June 2016 was normal.  He had a mammogram in September 2015 to evaluate gynecomastia and that was negative.  He has ulcerative colitis, for which he is on sulfasalazine.  His last colonoscopy was done in February 2016 and revealed left sided ulcerative colitis, as well as diverticulosis, but was otherwise normal.  He underwent cholecystectomy in February 2018.  He has had mild chronic mild anemia since 2015, with worsening in 2019, with a hemoglobin as low as 12.3.   Evaluation of the anemia did not reveal a specific etiology.  There was no evidence of nutritional deficiency, monoclonal gammopathy or hemolysis.  He had a repeat evaluation by Dr. Micheal Likens in August 2019, at which time his hemoglobin was 12.7.  This did not reveal any nutritional deficiency contributing to his anemia.  Serum protein electrophoresis did not reveal any monoclonal gammopathy. He works  full time as a Psychologist, sport and exercise.   He had a colonoscopy with Dr. Lyndel Safe back in February 2020 which was clear, and was recommended to repeat this in 3 years.  His dose of  levothyroxine was decreased to 125 mcg daily.  INTERVAL HISTORY:  Devin [name] is here for annual follow up and states that he has been doing well.  He continues levothyroxine 125 mcg daily.  He does note some mild arthritis and carpal tunnel, but otherwise, denies complaints.  His hemoglobin has decreased from 12.2 to 11.1, and his white count and platelets are normal.  I will again check iron studies, B12 and folate today for further evaluation.  Chemistries are unremarkable.  His  appetite is good, and he has gained 1 pound since his last visit.  He denies fever, chills or other signs of infection.  He denies nausea, vomiting, bowel issues, or abdominal pain.  He denies sore throat, cough, dyspnea, or chest pain.  REVIEW OF SYSTEMS:  Review of Systems  Constitutional: Negative.  Negative for appetite change, chills, fatigue, fever and unexpected weight change.  HENT:  Negative.    Eyes: Negative.   Respiratory: Negative.  Negative for chest tightness, cough, hemoptysis, shortness of breath and wheezing.   Cardiovascular: Negative.  Negative for chest pain, leg swelling and palpitations.  Gastrointestinal: Negative.  Negative for abdominal distention, abdominal pain, blood in stool, constipation, diarrhea, nausea and vomiting.  Endocrine: Negative.   Genitourinary: Negative.  Negative for difficulty urinating, dysuria, frequency and hematuria.   Musculoskeletal:  Positive for arthralgias (mild). Negative for back pain, flank  pain, gait problem and myalgias.  Skin: Negative.   Neurological: Negative.  Negative for dizziness, extremity weakness, gait problem, headaches, light-headedness, numbness, seizures and speech difficulty.  Hematological: Negative.   Psychiatric/Behavioral: Negative.  Negative for depression and sleep disturbance. The  patient is not nervous/anxious.   All other systems reviewed and are negative.   VITALS:  Blood pressure 134/84, pulse 69, temperature 98.2 F (36.8 C), temperature source Oral, resp. rate 18, height 5' 9.5" (1.765 m), weight 202 lb 3.2 oz (91.7 kg), SpO2 98 %.  Wt Readings from Last 3 Encounters:  01/25/21 202 lb 3.2 oz (91.7 kg)  01/10/21 201 lb 8 oz (91.4 kg)  09/23/20 190 lb (86.2 kg)    Body mass index is 29.43 kg/m.  Performance status (ECOG): 1 - Symptomatic but completely ambulatory  PHYSICAL EXAM:  Physical Exam Constitutional:      General: He is not in acute distress.    Appearance: Normal appearance. He is normal weight.  HENT:     Head: Normocephalic and atraumatic.  Eyes:     General: No scleral icterus.    Extraocular Movements: Extraocular movements intact.     Conjunctiva/sclera: Conjunctivae normal.     Pupils: Pupils are equal, round, and reactive to light.  Neck:     Comments: Faded thyroidectomy scar of the anterior neck. Cardiovascular:     Rate and Rhythm: Normal rate and regular rhythm.     Pulses: Normal pulses.     Heart sounds: Normal heart sounds. No murmur heard.   No friction rub. No gallop.  Pulmonary:     Effort: Pulmonary effort is normal. No respiratory distress.     Breath sounds: Normal breath sounds.  Abdominal:     General: Bowel sounds are normal. There is no distension.     Palpations: Abdomen is soft. There is no hepatomegaly, splenomegaly or mass.     Tenderness: There is no abdominal tenderness.  Musculoskeletal:        General: Normal range of motion.     Cervical back: Normal range of motion and neck supple.     Right lower leg: No edema.     Left lower leg: No edema.  Lymphadenopathy:     Cervical: No cervical adenopathy.  Skin:    General: Skin is warm and dry.  Neurological:     General: No focal deficit present.     Mental Status: He is alert and oriented to person, place, and time. Mental status is at baseline.   Psychiatric:        Mood and Affect: Mood normal.        Behavior: Behavior normal.        Thought Content: Thought content normal.        Judgment: Judgment normal.    LABS:   CBC Latest Ref Rng & Units 01/25/2021 09/21/2020  WBC - 7.3 9.6  Hemoglobin 13.5 - 17.5 11.1(A) 11.6(L)  Hematocrit 41 - 53 33(A) 35.4(L)  Platelets 150 - 399 308 421(H)   CMP Latest Ref Rng & Units 01/25/2021 09/21/2020  Glucose 70 - 99 mg/dL - 116(H)  BUN 4 - 21 20 17   Creatinine 0.6 - 1.3 0.8 0.87  Sodium 137 - 147 139 136  Potassium 3.4 - 5.3 4.1 4.1  Chloride 99 - 108 106 103  CO2 13 - 22 26(A) 25  Calcium 8.7 - 10.7 8.8 9.3  Alkaline Phos 25 - 125 118 -  AST 14 - 40 28 -  ALT 10 - 40 21 -    STUDIES:  No results found.   Allergies: No Known Allergies  Current Medications: Current Outpatient Medications  Medication Sig Dispense Refill   levothyroxine (SYNTHROID) 125 MCG tablet Take 125 mcg by mouth daily before breakfast.     losartan (COZAAR) 100 MG tablet Take 100 mg by mouth daily.     Multiple Vitamin (MULTIVITAMIN) capsule Take 1 capsule by mouth daily.     sulfaSALAzine (AZULFIDINE) 500 MG tablet Take 2 tablets (1,000 mg total) by mouth 2 (two) times daily. 360 tablet 4   No current facility-administered medications for this visit.     ASSESSMENT & PLAN:   Assessment:   1. Remote history of thyroid cancer diagnosed in 1997, treated with surgery and iodine 131. He remains without evidence of recurrence.  He will continue on levothyroxine 125 mcg daily pending the thyroid tests from today.  2. Anemia, worsening.  Previous evaluation did not reveal a specific etiology, but since it has been many years, I will recheck iron studies, B12 and folate.    Plan: As his anemia has worsened, I will recheck iron studies, B12 and folate today for further evaluation.  I will contact him with these results as well as his thyroid tests.  Once we have that results, he will need a refill of his  thyroid medication.  Otherwise, we will plan to see him back in 1 year with a CBC, comprehensive metabolic panel, TSH, thyroglobulin, and antithyroglobulin antibodies.  The patient understands the plans discussed today and is in agreement with them.  He knows to contact our office if he develops concerns regarding his thyroid cancer.   I provided 20 minutes of face-to-face time during this this encounter and > 50% was spent counseling as documented under my assessment and plan.    Derwood Kaplan, MD [facility] AT [facility] [address] [place] [address] Dept: [phone] Dept Fax: [phone]   I, Rita Ohara, am acting as scribe for Derwood Kaplan, MD  I have reviewed this report as typed by the medical scribe, and it is complete and accurate.

## 2021-01-25 ENCOUNTER — Other Ambulatory Visit: Payer: Self-pay

## 2021-01-25 ENCOUNTER — Inpatient Hospital Stay (INDEPENDENT_AMBULATORY_CARE_PROVIDER_SITE_OTHER): Payer: Medicare Other | Admitting: Oncology

## 2021-01-25 ENCOUNTER — Encounter: Payer: Self-pay | Admitting: Oncology

## 2021-01-25 ENCOUNTER — Telehealth: Payer: Self-pay | Admitting: Oncology

## 2021-01-25 ENCOUNTER — Inpatient Hospital Stay: Payer: Medicare Other | Attending: Oncology

## 2021-01-25 ENCOUNTER — Other Ambulatory Visit: Payer: Self-pay | Admitting: Oncology

## 2021-01-25 DIAGNOSIS — C73 Malignant neoplasm of thyroid gland: Secondary | ICD-10-CM

## 2021-01-25 DIAGNOSIS — Z79899 Other long term (current) drug therapy: Secondary | ICD-10-CM | POA: Diagnosis not present

## 2021-01-25 DIAGNOSIS — Z85850 Personal history of malignant neoplasm of thyroid: Secondary | ICD-10-CM | POA: Insufficient documentation

## 2021-01-25 DIAGNOSIS — D649 Anemia, unspecified: Secondary | ICD-10-CM | POA: Insufficient documentation

## 2021-01-25 DIAGNOSIS — R6889 Other general symptoms and signs: Secondary | ICD-10-CM

## 2021-01-25 LAB — BASIC METABOLIC PANEL
BUN: 20 (ref 4–21)
CO2: 26 — AB (ref 13–22)
Chloride: 106 (ref 99–108)
Creatinine: 0.8 (ref 0.6–1.3)
Glucose: 117
Potassium: 4.1 (ref 3.4–5.3)
Sodium: 139 (ref 137–147)

## 2021-01-25 LAB — HEPATIC FUNCTION PANEL
ALT: 21 (ref 10–40)
AST: 28 (ref 14–40)
Alkaline Phosphatase: 118 (ref 25–125)
Bilirubin, Total: 0.4

## 2021-01-25 LAB — CBC AND DIFFERENTIAL
HCT: 33 — AB (ref 41–53)
Hemoglobin: 11.1 — AB (ref 13.5–17.5)
Neutrophils Absolute: 4.53
Platelets: 308 (ref 150–399)
WBC: 7.3

## 2021-01-25 LAB — CBC: RBC: 3.85 — AB (ref 3.87–5.11)

## 2021-01-25 LAB — COMPREHENSIVE METABOLIC PANEL
Albumin: 3.8 (ref 3.5–5.0)
Calcium: 8.8 (ref 8.7–10.7)

## 2021-01-25 LAB — TSH: TSH: 0.166 u[IU]/mL — ABNORMAL LOW (ref 0.350–4.500)

## 2021-01-25 NOTE — Telephone Encounter (Signed)
Per 7/13 LOS, patient scheduled for July 2023 Appt's.  Gave patient Appt Summary

## 2021-01-26 ENCOUNTER — Telehealth: Payer: Self-pay

## 2021-01-26 ENCOUNTER — Other Ambulatory Visit: Payer: Self-pay | Admitting: Oncology

## 2021-01-26 DIAGNOSIS — C73 Malignant neoplasm of thyroid gland: Secondary | ICD-10-CM

## 2021-01-26 LAB — THYROGLOBULIN ANTIBODY: Thyroglobulin Antibody: <1 IU/mL (ref 0.0–0.9)

## 2021-01-26 MED ORDER — LEVOTHYROXINE SODIUM 125 MCG PO TABS: 125.0000 ug | ORAL_TABLET | Freq: Every day | ORAL | 3 refills | Status: DC

## 2021-01-26 NOTE — Telephone Encounter (Addendum)
Pt notified of below. He wanted to make sure [name] set in enough to last until she sees him again ( in a year). I told him I would give [name] his message. ----- Message from Derwood Kaplan, MD sent at 01/26/2021  2:37 PM EDT ----- Regarding: call pt Tell him thyroid looks good, so I will send in the Rx for supplement to stay the same at 125 mcg

## 2021-01-27 DIAGNOSIS — G5602 Carpal tunnel syndrome, left upper limb: Secondary | ICD-10-CM | POA: Diagnosis not present

## 2021-02-01 DIAGNOSIS — I714 Abdominal aortic aneurysm, without rupture: Secondary | ICD-10-CM | POA: Diagnosis not present

## 2021-02-01 DIAGNOSIS — I723 Aneurysm of iliac artery: Secondary | ICD-10-CM | POA: Diagnosis not present

## 2021-02-01 LAB — THYROGLOBULIN LEVEL: Thyroglobulin: <2 ng/mL

## 2021-02-17 ENCOUNTER — Other Ambulatory Visit: Payer: Self-pay | Admitting: *Deleted

## 2021-02-17 ENCOUNTER — Other Ambulatory Visit: Payer: Self-pay

## 2021-02-17 ENCOUNTER — Ambulatory Visit (INDEPENDENT_AMBULATORY_CARE_PROVIDER_SITE_OTHER): Payer: Medicare Other | Admitting: Vascular Surgery

## 2021-02-17 ENCOUNTER — Encounter: Payer: Self-pay | Admitting: Vascular Surgery

## 2021-02-17 VITALS — BP 134/75 | HR 69 | Temp 98.1°F | Resp 20 | Ht 69.5 in | Wt 199.0 lb

## 2021-02-17 DIAGNOSIS — I723 Aneurysm of iliac artery: Secondary | ICD-10-CM | POA: Diagnosis not present

## 2021-02-17 NOTE — Progress Notes (Signed)
Patient ID: Devin [name], male   DOB: Jan 11, 1941, 80 y.o.   MRN: [id number]  Reason for Consult: New Patient (Initial Visit)   Referred by Penelope Coop, FNP  Subjective:     HPI:  Devin [name] is a 80 y.o. male history of known left common iliac artery aneurysm.  His wife also had an aneurysm but they have no family history.  He denies any history of stroke, TIA or amaurosis.  He currently works as a Psychologist, sport and exercise.  He has not had any previous vascular history.  He does not take any blood thinners.  He has no new back or abdominal pain.  He has had back surgery in the past.  He recently had left carpal tunnel release which he has recovered well from.  Past Medical History:  Diagnosis Date   Carpal tunnel syndrome    right hand   GERD (gastroesophageal reflux disease)    History of diverticulitis    Hypothyroidism    Left sided ulcerative colitis with rectal bleeding ([address])    Thyroid cancer ([place]) 1997   Family History  Problem Relation Age of Onset   Colon cancer Neg Hx    Esophageal cancer Neg Hx    Pancreatic cancer Neg Hx    Stomach cancer Neg Hx    Liver disease Neg Hx    Past Surgical History:  Procedure Laterality Date   ANTERIOR CERVICAL DECOMP/DISCECTOMY FUSION N/A 09/23/2020   Procedure: Anterior Cervical Discectomy Fusion - Cervical Three-Cervical Four;  Surgeon: Eustace Moore, MD;  Location: [facility];  Service: Neurosurgery;  Laterality: N/A;  Anterior Cervical Discectomy Fusion - Cervical Three-Cervical Four   CARPAL TUNNEL RELEASE Right 09/23/2020   Procedure: Right carpal tunnel release;  Surgeon: Eustace Moore, MD;  Location: [facility];  Service: Neurosurgery;  Laterality: Right;  3C   CHOLECYSTECTOMY     per patient "maybe about 15 years ago"   COLONOSCOPY  09/06/2014   Left sided ulcerative colitis up to 55cm. Normal remaining colon. Normal TI. Mild pancolonic diverticulosis. Status post sigmoid resection with patent colo-colic anastomosis.     ESOPHAGOGASTRODUODENOSCOPY     Dr Melina Copa. Years ago per patient    [place] or 1997   right leg per patient    LOW ANTERIOR BOWEL RESECTION     MOUTH SURGERY     ROTATOR CUFF REPAIR     THYROIDECTOMY     TONSILLECTOMY     per patient "as a youngster"    Short Social History:  Social History   Tobacco Use   Smoking status: Never   Smokeless tobacco: Never   Tobacco comments:    said he smoked apack in his life as a  teen  Substance Use Topics   Alcohol use: Not Currently    No Known Allergies  Current Outpatient Medications  Medication Sig Dispense Refill   levothyroxine (SYNTHROID) 125 MCG tablet Take 1 tablet (125 mcg total) by mouth daily before breakfast. 90 tablet 3   losartan (COZAAR) 100 MG tablet Take 100 mg by mouth daily.     Multiple Vitamin (MULTIVITAMIN) capsule Take 1 capsule by mouth daily.     pregabalin (LYRICA) 50 MG capsule Take by mouth.     sulfaSALAzine (AZULFIDINE) 500 MG tablet Take 2 tablets (1,000 mg total) by mouth 2 (two) times daily. 360 tablet 4   No current facility-administered medications for this visit.    Review of Systems  Constitutional:  Constitutional negative.  HENT: HENT negative.  Eyes: Eyes negative.  Respiratory: Respiratory negative.  Cardiovascular: Cardiovascular negative.  GI: Gastrointestinal negative.  Musculoskeletal: Musculoskeletal negative.  Skin: Skin negative.  Neurological: Neurological negative. Hematologic: Hematologic/lymphatic negative.  Psychiatric: Psychiatric negative.       Objective:  Objective   Vitals:   02/17/21 0927  BP: 134/75  Pulse: 69  Resp: 20  Temp: 98.1 F (36.7 C)  SpO2: 98%  Weight: 199 lb (90.3 kg)  Height: 5' 9.5" (1.765 m)   Body mass index is 28.97 kg/m.  Physical Exam HENT:     Head: Normocephalic.     Nose:     Comments: Wearing a mask Eyes:     Pupils: Pupils are equal, round, and reactive to light.  Cardiovascular:     Pulses:           Popliteal pulses are 3+ on the right side and 3+ on the left side.       Dorsalis pedis pulses are 2+ on the right side and 2+ on the left side.  Pulmonary:     Effort: Pulmonary effort is normal.  Abdominal:     General: Abdomen is flat.     Palpations: Abdomen is soft.  Musculoskeletal:        General: No swelling. Normal range of motion.     Cervical back: Normal range of motion and neck supple.  Skin:    Capillary Refill: Capillary refill takes less than 2 seconds.  Neurological:     General: No focal deficit present.     Mental Status: He is alert.  Psychiatric:        Mood and Affect: Mood normal.    Data: ULTRASOUND RETROPERITONEAL LIMITED  TECHNIQUE: Ultrasound examination of the abdominal aorta was performed to evaluate for abdominal aortic aneurysm. The common iliac arteries, IVC, and kidneys were also evaluated.  COMPARISON: CT abdomen pelvis 06/02/2019  FINDINGS: Abdominal Aorta  No aneurysm identified.  Maximum AP  Diameter: 2.8 cm  Maximum TRV  Diameter: 2.6 cm  Right Common Iliac Artery  1.3 x 1.5 cm  Left Common Iliac Artery  2.8 x 3.2 cm.  IMPRESSION: Interval increase in maximum diameter of left common iliac artery aneurysm which now measures up to 3.2 cm compared to 2.5 cm on prior exam.     Assessment/Plan:     80 year old male with recent duplex of his aorta and common iliac arteries bilaterally which demonstrates 3.2 cm left common iliac artery.  This is an increase in size from previous scan which was 2.6.  This is an asymptomatic enlargement.  We discussed our plan for repair at 3.5 cm if he is an endovascular candidate 4.0 cm if he requires an open repair.  He also has enlarged popliteal arteries by physical exam today.  I will have him follow-up in 6 to 8 weeks with CT angio to evaluate his left common neck artery aneurysm and bilateral popliteal artery duplexes for evaluation.  We discussed the signs and symptoms of rupture which  she demonstrates good understanding.     Waynetta Sandy MD [facility]

## 2021-02-24 ENCOUNTER — Encounter: Payer: Self-pay | Admitting: Hematology and Oncology

## 2021-02-24 LAB — VITAMIN B12: VITAMIN B12: 848

## 2021-02-24 LAB — FOLATE: Folate: 15.9

## 2021-03-21 DIAGNOSIS — G5601 Carpal tunnel syndrome, right upper limb: Secondary | ICD-10-CM | POA: Diagnosis not present

## 2021-03-22 DIAGNOSIS — D509 Iron deficiency anemia, unspecified: Secondary | ICD-10-CM | POA: Diagnosis not present

## 2021-03-22 DIAGNOSIS — E063 Autoimmune thyroiditis: Secondary | ICD-10-CM | POA: Diagnosis not present

## 2021-03-28 ENCOUNTER — Other Ambulatory Visit: Payer: Self-pay | Admitting: *Deleted

## 2021-03-28 DIAGNOSIS — I723 Aneurysm of iliac artery: Secondary | ICD-10-CM

## 2021-03-29 ENCOUNTER — Other Ambulatory Visit: Payer: Self-pay

## 2021-03-29 ENCOUNTER — Other Ambulatory Visit: Payer: Self-pay | Admitting: *Deleted

## 2021-03-29 DIAGNOSIS — I723 Aneurysm of iliac artery: Secondary | ICD-10-CM

## 2021-04-11 ENCOUNTER — Ambulatory Visit (HOSPITAL_COMMUNITY): Payer: Medicare Other

## 2021-04-13 DIAGNOSIS — N200 Calculus of kidney: Secondary | ICD-10-CM | POA: Diagnosis not present

## 2021-04-13 DIAGNOSIS — N281 Cyst of kidney, acquired: Secondary | ICD-10-CM | POA: Diagnosis not present

## 2021-04-13 DIAGNOSIS — I723 Aneurysm of iliac artery: Secondary | ICD-10-CM | POA: Diagnosis not present

## 2021-04-18 DIAGNOSIS — G5601 Carpal tunnel syndrome, right upper limb: Secondary | ICD-10-CM | POA: Diagnosis not present

## 2021-04-19 ENCOUNTER — Ambulatory Visit: Payer: Medicare Other | Admitting: Vascular Surgery

## 2021-04-19 ENCOUNTER — Encounter (HOSPITAL_COMMUNITY): Payer: Medicare Other

## 2021-04-26 ENCOUNTER — Ambulatory Visit (INDEPENDENT_AMBULATORY_CARE_PROVIDER_SITE_OTHER): Payer: Medicare Other | Admitting: Vascular Surgery

## 2021-04-26 ENCOUNTER — Encounter: Payer: Self-pay | Admitting: Vascular Surgery

## 2021-04-26 ENCOUNTER — Ambulatory Visit (HOSPITAL_COMMUNITY)
Admission: RE | Admit: 2021-04-26 | Discharge: 2021-04-26 | Disposition: A | Discharge status: Home / Self Care | Payer: Medicare Other | Source: Ambulatory Visit | Attending: Vascular Surgery | Admitting: Vascular Surgery

## 2021-04-26 ENCOUNTER — Other Ambulatory Visit: Payer: Self-pay

## 2021-04-26 VITALS — BP 182/84 | HR 63 | Temp 97.9°F | Resp 20 | Ht 69.5 in | Wt 196.0 lb

## 2021-04-26 DIAGNOSIS — I723 Aneurysm of iliac artery: Secondary | ICD-10-CM

## 2021-04-26 NOTE — Progress Notes (Signed)
Patient ID: Devin [name], male   DOB: 16-Jun-1941, 80 y.o.   MRN: [id number]  Reason for Consult: Follow-up   Referred by Penelope Coop, FNP  Subjective:     HPI:  Devin [name] is a 80 y.o. male with known history of left common iliac artery aneurysm.  He continues to work hard is a former every day.  He now follows up with CT scan.  No new back or abdominal pain continues to walk without issues.  No history of stroke, TIA or amaurosis.  Past Medical History:  Diagnosis Date   Carpal tunnel syndrome    right hand   GERD (gastroesophageal reflux disease)    History of diverticulitis    Hypothyroidism    Left sided ulcerative colitis with rectal bleeding ([place])    Thyroid cancer ([place]) 1997   Family History  Problem Relation Age of Onset   Colon cancer Neg Hx    Esophageal cancer Neg Hx    Pancreatic cancer Neg Hx    Stomach cancer Neg Hx    Liver disease Neg Hx    Past Surgical History:  Procedure Laterality Date   ANTERIOR CERVICAL DECOMP/DISCECTOMY FUSION N/A 09/23/2020   Procedure: Anterior Cervical Discectomy Fusion - Cervical Three-Cervical Four;  Surgeon: Eustace Moore, MD;  Location: [facility];  Service: Neurosurgery;  Laterality: N/A;  Anterior Cervical Discectomy Fusion - Cervical Three-Cervical Four   CARPAL TUNNEL RELEASE Right 09/23/2020   Procedure: Right carpal tunnel release;  Surgeon: Eustace Moore, MD;  Location: [facility];  Service: Neurosurgery;  Laterality: Right;  3C   CHOLECYSTECTOMY     per patient "maybe about 15 years ago"   COLONOSCOPY  09/06/2014   Left sided ulcerative colitis up to 55cm. Normal remaining colon. Normal TI. Mild pancolonic diverticulosis. Status post sigmoid resection with patent colo-colic anastomosis.    ESOPHAGOGASTRODUODENOSCOPY     Dr Melina Copa. Years ago per patient    [facility] [place] or 1997   right leg per patient    LOW ANTERIOR BOWEL RESECTION     MOUTH SURGERY     ROTATOR CUFF REPAIR      THYROIDECTOMY     TONSILLECTOMY     per patient "as a youngster"    Short Social History:  Social History   Tobacco Use   Smoking status: Never   Smokeless tobacco: Never   Tobacco comments:    said he smoked apack in his life as a  teen  Substance Use Topics   Alcohol use: Not Currently    No Known Allergies  Current Outpatient Medications  Medication Sig Dispense Refill   levothyroxine (SYNTHROID) 125 MCG tablet Take 1 tablet (125 mcg total) by mouth daily before breakfast. 90 tablet 3   losartan (COZAAR) 100 MG tablet Take 100 mg by mouth daily.     Multiple Vitamin (MULTIVITAMIN) capsule Take 1 capsule by mouth daily.     pregabalin (LYRICA) 50 MG capsule Take by mouth.     sulfaSALAzine (AZULFIDINE) 500 MG tablet Take 2 tablets (1,000 mg total) by mouth 2 (two) times daily. 360 tablet 4   No current facility-administered medications for this visit.    Review of Systems  Constitutional:  Constitutional negative. HENT: HENT negative.  Eyes: Eyes negative.  Respiratory: Respiratory negative.  Cardiovascular: Cardiovascular negative.  GI: Gastrointestinal negative.  Musculoskeletal: Musculoskeletal negative.  Skin: Skin negative.  Neurological: Neurological negative. Hematologic: Hematologic/lymphatic negative.  Psychiatric: Psychiatric negative.  Objective:  Objective   Vitals:   04/26/21 1125  BP: (!) 182/84  Pulse: 63  Resp: 20  Temp: 97.9 F (36.6 C)  SpO2: 98%  Weight: 196 lb (88.9 kg)  Height: 5' 9.5" (1.765 m)   Body mass index is 28.53 kg/m.  Physical Exam HENT:     Head: Normocephalic.     Nose:     Comments: Wearing a mask Eyes:     Pupils: Pupils are equal, round, and reactive to light.  Cardiovascular:     Pulses:          Femoral pulses are 2+ on the right side and 2+ on the left side.      Popliteal pulses are 3+ on the right side and 3+ on the left side.  Pulmonary:     Effort: Pulmonary effort is normal.  Abdominal:      General: Abdomen is flat.     Palpations: Abdomen is soft. There is no mass.  Musculoskeletal:        General: Normal range of motion.     Right lower leg: No edema.     Left lower leg: No edema.  Skin:    General: Skin is warm and dry.     Capillary Refill: Capillary refill takes less than 2 seconds.  Neurological:     Mental Status: He is alert.  Psychiatric:        Mood and Affect: Mood normal.        Behavior: Behavior normal.    Data: +---------------+-------+-----------+---------+--------+-----+--------+  Right PoplitealAP (cm)Transv (cm)Waveform StenosisShapeComments  +---------------+-------+-----------+---------+--------+-----+--------+  Proximal       0.91   0.86       triphasic                       +---------------+-------+-----------+---------+--------+-----+--------+  Mid            0.98   1.00       triphasic                       +---------------+-------+-----------+---------+--------+-----+--------+  Distal         0.83   0.91       triphasic                       +---------------+-------+-----------+---------+--------+-----+--------+   +--------------+-------+-----------+---------+--------+-----+--------+  Left PoplitealAP (cm)Transv (cm)Waveform StenosisShapeComments  +--------------+-------+-----------+---------+--------+-----+--------+  Proximal      0.85   0.90       triphasic                       +--------------+-------+-----------+---------+--------+-----+--------+  Mid           0.89   0.90       triphasic                       +--------------+-------+-----------+---------+--------+-----+--------+  Distal        0.74   0.81       triphasic                       +--------------+-------+-----------+---------+--------+-----+--------+      Summary:  Right: No visualized popliteal aneurysm.  1.08 x 3.5 fluid collection in the popliteal fossa.   Left: No visualized popliteal aneurysm.  4.0 x  1.18 fluid collection in the popliteal fossa. ..  CT IMPRESSION: VASCULAR  1. Left common iliac artery aneurysm is  minimally increased in size measuring 2.7 cm on today's exam, previously 2.5 cm in 2020.  NON-VASCULAR  1. No acute findings in abdomen pelvis.      Assessment/Plan:     80 year old male with stable sized left common iliac artery aneurysm.  We reviewed his CT scan together today.  We will get repeat aortoiliac duplex in 1 year.  We discussed the signs and symptoms of rupture that would require emergent medical evaluation.  Short of this we will see him in 1 year for further evaluation.    Waynetta Sandy MD [facility]

## 2021-04-27 DIAGNOSIS — G4700 Insomnia, unspecified: Secondary | ICD-10-CM | POA: Diagnosis not present

## 2021-04-27 DIAGNOSIS — Z6829 Body mass index (BMI) 29.0-29.9, adult: Secondary | ICD-10-CM | POA: Diagnosis not present

## 2021-04-27 DIAGNOSIS — Z862 Personal history of diseases of the blood and blood-forming organs and certain disorders involving the immune mechanism: Secondary | ICD-10-CM | POA: Diagnosis not present

## 2021-04-27 DIAGNOSIS — I10 Essential (primary) hypertension: Secondary | ICD-10-CM | POA: Diagnosis not present

## 2021-04-27 DIAGNOSIS — E063 Autoimmune thyroiditis: Secondary | ICD-10-CM | POA: Diagnosis not present

## 2021-04-27 DIAGNOSIS — Z23 Encounter for immunization: Secondary | ICD-10-CM | POA: Diagnosis not present

## 2021-04-27 DIAGNOSIS — Z9181 History of falling: Secondary | ICD-10-CM | POA: Diagnosis not present

## 2021-04-27 DIAGNOSIS — E785 Hyperlipidemia, unspecified: Secondary | ICD-10-CM | POA: Diagnosis not present

## 2021-04-27 DIAGNOSIS — Z1331 Encounter for screening for depression: Secondary | ICD-10-CM | POA: Diagnosis not present

## 2021-06-14 DIAGNOSIS — H40013 Open angle with borderline findings, low risk, bilateral: Secondary | ICD-10-CM | POA: Diagnosis not present

## 2021-08-24 DIAGNOSIS — R351 Nocturia: Secondary | ICD-10-CM | POA: Diagnosis not present

## 2021-08-24 DIAGNOSIS — E063 Autoimmune thyroiditis: Secondary | ICD-10-CM | POA: Diagnosis not present

## 2021-08-24 DIAGNOSIS — E785 Hyperlipidemia, unspecified: Secondary | ICD-10-CM | POA: Diagnosis not present

## 2021-08-24 DIAGNOSIS — Z862 Personal history of diseases of the blood and blood-forming organs and certain disorders involving the immune mechanism: Secondary | ICD-10-CM | POA: Diagnosis not present

## 2021-08-24 DIAGNOSIS — Z6828 Body mass index (BMI) 28.0-28.9, adult: Secondary | ICD-10-CM | POA: Diagnosis not present

## 2021-08-24 DIAGNOSIS — G4700 Insomnia, unspecified: Secondary | ICD-10-CM | POA: Diagnosis not present

## 2021-08-24 DIAGNOSIS — M1990 Unspecified osteoarthritis, unspecified site: Secondary | ICD-10-CM | POA: Diagnosis not present

## 2021-08-24 DIAGNOSIS — I10 Essential (primary) hypertension: Secondary | ICD-10-CM | POA: Diagnosis not present

## 2021-08-30 ENCOUNTER — Telehealth: Payer: Self-pay

## 2021-08-30 NOTE — Telephone Encounter (Addendum)
I notified pt's wife of below. She verbalized understanding. 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 I confirmed next appt date & time.   Received: Today [name], LPN  [name], RN      Previous Messages   ----- Message -----  From: Devin Kaplan, MD  Sent: 08/30/2021   1:53 PM EST  To: [name], LPN  Subject: call                                           I see his labs from PCP & shows he is on too much thyroid so needs to go back to 112 mcg instead of 125 mcg.  Has his PCP already done that?  Is that what he is taking?  Do I need to send in Rx?    [name],RN:  I spoke with pt's wife, Devin [name]. She states that labs were done @ his PCP last week. On Friday, they called them & told them they were changing his synthroid dosage. Devin [name] wants to make sure that is the appropriate thing to do. She said the surgeon from [place] had told them in the past it was better to keep the thyroid lab a little higher to keep it from reproducing.  She states she spoke with [name] earlier in the week and she was to get lab results for Dr Devin [name] to review. Pt also needs brand Synthroid and PCP sent in generic.

## 2021-08-31 ENCOUNTER — Other Ambulatory Visit: Payer: Self-pay | Admitting: Oncology

## 2021-08-31 DIAGNOSIS — C73 Malignant neoplasm of thyroid gland: Secondary | ICD-10-CM

## 2021-08-31 MED ORDER — SYNTHROID 112 MCG PO TABS: 112.0000 ug | ORAL_TABLET | Freq: Every day | ORAL | 3 refills | Status: DC

## 2021-08-31 NOTE — Addendum Note (Signed)
Addended byDairl Ponder on: 08/31/2021 12:02 PM   Modules accepted: Orders

## 2021-09-01 DIAGNOSIS — R051 Acute cough: Secondary | ICD-10-CM | POA: Diagnosis not present

## 2021-09-01 DIAGNOSIS — Z20822 Contact with and (suspected) exposure to covid-19: Secondary | ICD-10-CM | POA: Diagnosis not present

## 2021-09-01 DIAGNOSIS — R058 Other specified cough: Secondary | ICD-10-CM | POA: Diagnosis not present

## 2021-09-01 DIAGNOSIS — U071 COVID-19: Secondary | ICD-10-CM | POA: Diagnosis not present

## 2021-09-01 DIAGNOSIS — J22 Unspecified acute lower respiratory infection: Secondary | ICD-10-CM | POA: Diagnosis not present

## 2021-09-05 ENCOUNTER — Telehealth: Payer: Self-pay

## 2021-09-05 NOTE — Telephone Encounter (Signed)
-----   Message from Derwood Kaplan, MD sent at 08/30/2021  1:51 PM EST ----- Regarding: call I see his labs from PCP & shows he is on too much thyroid so needs to go back to 112 mcg instead of 125 mcg.  Has his PCP already done that?  Is that what he is taking?  Do I need to send in Rx?

## 2021-09-05 NOTE — Telephone Encounter (Signed)
Wife states she picked up the prescription for the 172mg. Patient is currently taking the 1189m.

## 2021-09-13 ENCOUNTER — Encounter: Payer: Self-pay | Admitting: Gastroenterology

## 2021-09-15 DIAGNOSIS — Z6828 Body mass index (BMI) 28.0-28.9, adult: Secondary | ICD-10-CM | POA: Diagnosis not present

## 2021-09-15 DIAGNOSIS — R413 Other amnesia: Secondary | ICD-10-CM | POA: Diagnosis not present

## 2021-09-15 DIAGNOSIS — Z1331 Encounter for screening for depression: Secondary | ICD-10-CM | POA: Diagnosis not present

## 2021-09-15 DIAGNOSIS — Z0000 Encounter for general adult medical examination without abnormal findings: Secondary | ICD-10-CM | POA: Diagnosis not present

## 2021-09-15 DIAGNOSIS — G4700 Insomnia, unspecified: Secondary | ICD-10-CM | POA: Diagnosis not present

## 2021-09-15 LAB — CBC: RBC: 3.61 — AB (ref 3.87–5.11)

## 2021-09-15 LAB — CBC AND DIFFERENTIAL
HCT: 30 — AB (ref 41–53)
Hemoglobin: 10.5 — AB (ref 13.5–17.5)
Neutrophils Absolute: 5.7
Platelets: 351 10*3/uL (ref 150–400)
WBC: 7.7

## 2021-09-19 ENCOUNTER — Encounter: Payer: Self-pay | Admitting: Hematology and Oncology

## 2021-09-20 DIAGNOSIS — R413 Other amnesia: Secondary | ICD-10-CM | POA: Diagnosis not present

## 2021-09-20 NOTE — Progress Notes (Incomplete)
[facility]  [address] [place],  [place]  [address] [phone]  Clinic Day:  09/20/2021  Referring physician: Penelope Coop, FNP  This document serves as a record of services personally performed by Hosie Poisson, MD. It was created on their behalf by Curry,Lauren E, a trained medical scribe. The creation of this record is based on the scribe's personal observations and the provider's statements to them.  CHIEF COMPLAINT:  CC: History of thyroid cancer  Current Treatment:  Surveillance  HISTORY OF PRESENT ILLNESS:  Devin [name] is a 81 y.o. male with a history of thyroid cancer diagnosed in 1997.  He presented with fever and a palpable mass.  He underwent total thyroidectomy, as well as a right radical neck dissection, followed by radioactive iodine.  This was done at [facility] and he transferred his care here in August 2015 and has remained without evidence of disease.  Thyroid ultrasound in July 2016 revealed normal post thyroidectomy appearance, with no evidence of recurrence.  Chest x-ray in June 2016 was normal.  He had a mammogram in September 2015 to evaluate gynecomastia and that was negative.  He has ulcerative colitis, for which he is on sulfasalazine.  His last colonoscopy was done in February 2016 and revealed left sided ulcerative colitis, as well as diverticulosis, but was otherwise normal.  He underwent cholecystectomy in February 2018.  He has had mild chronic mild anemia since 2015, with worsening in 2019, with a hemoglobin as low as 12.3.   Evaluation of the anemia did not reveal a specific etiology.  There was no evidence of nutritional deficiency, monoclonal gammopathy or hemolysis.  He had a repeat evaluation by Dr. Micheal Likens in August 2019, at which time his hemoglobin was 12.7.  This did not reveal any nutritional deficiency contributing to his anemia.  Serum protein electrophoresis did not reveal any monoclonal gammopathy. He  works full time as a Psychologist, sport and exercise.   He had a colonoscopy with Dr. Lyndel Safe back in February 2020 which was clear, and was recommended to repeat this in 3 years.  His dose of  levothyroxine was decreased to 125 mcg daily.  INTERVAL HISTORY:  [name] is here for annual follow up and states that he has been doing well.  He continues levothyroxine 125 mcg daily.  He does note some mild arthritis and carpal tunnel, but otherwise, denies complaints.  His hemoglobin has decreased from 12.2 to 11.1, and his white count and platelets are normal.  I will again check iron studies, B12 and folate today for further evaluation.  Chemistries are unremarkable.  His  appetite is good, and he has gained 1 pound since his last visit.  He denies fever, chills or other signs of infection.  He denies nausea, vomiting, bowel issues, or abdominal pain.  He denies sore throat, cough, dyspnea, or chest pain.  [name] is here for follow up due to worsening anemia. Lab evaluation from March 3rd revealed a hemoglobin of 10.5, previously 11.1 in July. Iron studies B12 and folate from July were unremarkable.    His  appetite is good, and he has gained/lost _ pounds since his last visit.  He denies fever, chills or other signs of infection.  He denies nausea, vomiting, bowel issues, or abdominal pain.  He denies sore throat, cough, dyspnea, or chest pain.  REVIEW OF SYSTEMS:  Review of Systems  Constitutional: Negative.  Negative for appetite change, chills, fatigue, fever and unexpected weight change.  HENT:  Negative.    Eyes: Negative.   Respiratory: Negative.  Negative for chest tightness, cough, hemoptysis, shortness of breath and wheezing.   Cardiovascular: Negative.  Negative for chest pain, leg swelling and palpitations.  Gastrointestinal: Negative.  Negative for abdominal distention, abdominal pain, blood in stool, constipation, diarrhea, nausea and vomiting.  Endocrine: Negative.   Genitourinary: Negative.  Negative for  difficulty urinating, dysuria, frequency and hematuria.   Musculoskeletal: Negative.  Negative for arthralgias, back pain, flank pain, gait problem and myalgias.  Skin: Negative.   Neurological: Negative.  Negative for dizziness, extremity weakness, gait problem, headaches, light-headedness, numbness, seizures and speech difficulty.  Hematological: Negative.   Psychiatric/Behavioral: Negative.  Negative for depression and sleep disturbance. The patient is not nervous/anxious.   All other systems reviewed and are negative.   VITALS:  There were no vitals taken for this visit.  Wt Readings from Last 3 Encounters:  04/26/21 196 lb (88.9 kg)  02/17/21 199 lb (90.3 kg)  01/25/21 202 lb 3.2 oz (91.7 kg)    There is no height or weight on file to calculate BMI.  Performance status (ECOG): 1 - Symptomatic but completely ambulatory  PHYSICAL EXAM:  Physical Exam Constitutional:      General: He is not in acute distress.    Appearance: Normal appearance. He is normal weight.  HENT:     Head: Normocephalic and atraumatic.  Eyes:     General: No scleral icterus.    Extraocular Movements: Extraocular movements intact.     Conjunctiva/sclera: Conjunctivae normal.     Pupils: Pupils are equal, round, and reactive to light.  Cardiovascular:     Rate and Rhythm: Normal rate and regular rhythm.     Pulses: Normal pulses.     Heart sounds: Normal heart sounds. No murmur heard.   No friction rub. No gallop.  Pulmonary:     Effort: Pulmonary effort is normal. No respiratory distress.     Breath sounds: Normal breath sounds.  Abdominal:     General: Bowel sounds are normal. There is no distension.     Palpations: Abdomen is soft. There is no hepatomegaly, splenomegaly or mass.     Tenderness: There is no abdominal tenderness.  Musculoskeletal:        General: Normal range of motion.     Cervical back: Normal range of motion and neck supple.     Right lower leg: No edema.     Left lower leg:  No edema.  Lymphadenopathy:     Cervical: No cervical adenopathy.  Skin:    General: Skin is warm and dry.  Neurological:     General: No focal deficit present.     Mental Status: He is alert and oriented to person, place, and time. Mental status is at baseline.  Psychiatric:        Mood and Affect: Mood normal.        Behavior: Behavior normal.        Thought Content: Thought content normal.        Judgment: Judgment normal.    LABS:   CBC Latest Ref Rng & Units 09/15/2021 01/25/2021 09/21/2020  WBC - 7.7 7.3 9.6  Hemoglobin 13.5 - 17.5 10.5(A) 11.1(A) 11.6(L)  Hematocrit 41 - 53 30(A) 33(A) 35.4(L)  Platelets 150 - 400 K/uL 351 308 421(H)   CMP Latest Ref Rng & Units 01/25/2021 09/21/2020  Glucose 70 - 99 mg/dL - 116(H)  BUN 4 - 21 20 17   Creatinine 0.6 - 1.3  0.8 0.87  Sodium 137 - 147 139 136  Potassium 3.4 - 5.3 4.1 4.1  Chloride 99 - 108 106 103  CO2 13 - 22 26(A) 25  Calcium 8.7 - 10.7 8.8 9.3  Alkaline Phos 25 - 125 118 -  AST 14 - 40 28 -  ALT 10 - 40 21 -    STUDIES:  No results found.   Allergies: No Known Allergies  Current Medications: Current Outpatient Medications  Medication Sig Dispense Refill   Doxepin HCl 3 MG TABS Take 1 tablet by mouth at bedtime.     losartan (COZAAR) 100 MG tablet Take 100 mg by mouth daily.     Multiple Vitamin (MULTIVITAMIN) capsule Take 1 capsule by mouth daily.     pregabalin (LYRICA) 50 MG capsule Take by mouth.     sulfaSALAzine (AZULFIDINE) 500 MG tablet Take 2 tablets (1,000 mg total) by mouth 2 (two) times daily. 360 tablet 4   SYNTHROID 112 MCG tablet Take 1 tablet (112 mcg total) by mouth daily before breakfast. 90 tablet 3   tamsulosin (FLOMAX) 0.4 MG CAPS capsule Take 0.4 mg by mouth every morning.     No current facility-administered medications for this visit.     ASSESSMENT & PLAN:   Assessment:   1. Remote history of thyroid cancer diagnosed in 1997, treated with surgery and iodine 131. He remains without  evidence of recurrence.  He will continue on levothyroxine 125 mcg daily pending the thyroid tests from today.  2. Anemia, worsening.  Previous evaluation from July 2022 did not reveal a specific etiology. Iron studies, B12 and folate were all unremarkable.    Plan: I will contact him with these results as well as his thyroid tests.  Once we have that results, he will need a refill of his thyroid medication.  Otherwise, we will plan to see him back in 1 year with a CBC, comprehensive metabolic panel, TSH, thyroglobulin, and antithyroglobulin antibodies.  The patient understands the plans discussed today and is in agreement with them.  He knows to contact our office if he develops concerns regarding his thyroid cancer.   I provided 20 minutes of face-to-face time during this this encounter and > 50% was spent counseling as documented under my assessment and plan.    Derwood Kaplan, MD [facility] AT [facility] [address] [place] [address] Dept: [phone] Dept Fax: [phone]   I, Rita Ohara, am acting as scribe for Derwood Kaplan, MD  I have reviewed this report as typed by the medical scribe, and it is complete and accurate.

## 2021-09-21 ENCOUNTER — Telehealth: Payer: Self-pay

## 2021-09-21 NOTE — Telephone Encounter (Signed)
-----   Message from Derwood Kaplan, MD sent at 09/19/2021  9:50 AM EST ----- ?Regarding: labs & appt ?Dr. Lin Landsman sent me a lab report showing his anemia is worse.  I rec we schedule appt to evaluate with labs, let pt know and let Dr. Janace Aris office know we will f/u (he sent text to Dr. Bobby Rumpf) ? ?

## 2021-09-21 NOTE — Telephone Encounter (Signed)
notified

## 2021-09-26 ENCOUNTER — Other Ambulatory Visit: Payer: Self-pay | Admitting: Oncology

## 2021-09-26 DIAGNOSIS — C73 Malignant neoplasm of thyroid gland: Secondary | ICD-10-CM

## 2021-09-27 ENCOUNTER — Inpatient Hospital Stay: Payer: Medicare Other

## 2021-09-27 ENCOUNTER — Inpatient Hospital Stay: Payer: Medicare Other | Attending: Oncology | Admitting: Oncology

## 2021-09-27 ENCOUNTER — Other Ambulatory Visit: Payer: Self-pay

## 2021-09-27 ENCOUNTER — Encounter: Payer: Self-pay | Admitting: Oncology

## 2021-09-27 ENCOUNTER — Other Ambulatory Visit: Payer: Self-pay | Admitting: Oncology

## 2021-09-27 VITALS — BP 175/85 | HR 63 | Temp 98.0°F | Resp 18 | Ht 69.5 in | Wt 194.2 lb

## 2021-09-27 DIAGNOSIS — R6889 Other general symptoms and signs: Secondary | ICD-10-CM

## 2021-09-27 DIAGNOSIS — Z85850 Personal history of malignant neoplasm of thyroid: Secondary | ICD-10-CM | POA: Insufficient documentation

## 2021-09-27 DIAGNOSIS — D649 Anemia, unspecified: Secondary | ICD-10-CM

## 2021-09-27 DIAGNOSIS — K5150 Left sided colitis without complications: Secondary | ICD-10-CM | POA: Insufficient documentation

## 2021-09-27 DIAGNOSIS — C73 Malignant neoplasm of thyroid gland: Secondary | ICD-10-CM

## 2021-09-27 LAB — FOLATE: Folate: 19.4 ng/mL (ref >5.9)

## 2021-09-27 LAB — BASIC METABOLIC PANEL
BUN: 18 (ref 4–21)
CO2: 27 — AB (ref 13–22)
Chloride: 108 (ref 99–108)
Creatinine: 0.8 (ref 0.6–1.3)
Glucose: 112
Potassium: 4 mEq/L (ref 3.5–5.1)
Sodium: 141 (ref 137–147)

## 2021-09-27 LAB — CBC AND DIFFERENTIAL
HCT: 36 — AB (ref 41–53)
Hemoglobin: 11.4 — AB (ref 13.5–17.5)
Neutrophils Absolute: 3.43
Platelets: 259 10*3/uL (ref 150–400)
WBC: 6.6

## 2021-09-27 LAB — HEPATIC FUNCTION PANEL
ALT: 26 U/L (ref 10–40)
AST: 33 (ref 14–40)
Alkaline Phosphatase: 137 — AB (ref 25–125)
Bilirubin, Total: 0.4

## 2021-09-27 LAB — IRON AND TIBC
Iron: 54 ug/dL (ref 45–182)
Saturation Ratios: 18 % (ref 17.9–39.5)
TIBC: 298 ug/dL (ref 250–450)
UIBC: 244 ug/dL

## 2021-09-27 LAB — COMPREHENSIVE METABOLIC PANEL
Albumin: 3.9 (ref 3.5–5.0)
Calcium: 8.8 (ref 8.7–10.7)

## 2021-09-27 LAB — SEDIMENTATION RATE: Sed Rate: 14 mm/hr (ref 0–16)

## 2021-09-27 LAB — FERRITIN: Ferritin: 61 ng/mL (ref 24–336)

## 2021-09-27 LAB — CBC: RBC: 4.16 (ref 3.87–5.11)

## 2021-09-27 LAB — VITAMIN B12: Vitamin B-12: 593 pg/mL (ref 180–914)

## 2021-09-28 LAB — RHEUMATOID FACTOR: Rheumatoid fact SerPl-aCnc: 12.4 IU/mL (ref <14.0)

## 2021-09-29 LAB — PROTEIN ELECTROPHORESIS, SERUM
A/G Ratio: 1.2 (ref 0.7–1.7)
Albumin ELP: 3.5 g/dL (ref 2.9–4.4)
Alpha-1-Globulin: 0.2 g/dL (ref 0.0–0.4)
Alpha-2-Globulin: 0.8 g/dL (ref 0.4–1.0)
Beta Globulin: 0.8 g/dL (ref 0.7–1.3)
Gamma Globulin: 1.3 g/dL (ref 0.4–1.8)
Globulin, Total: 3 g/dL (ref 2.2–3.9)
Total Protein ELP: 6.5 g/dL (ref 6.0–8.5)

## 2021-10-09 DIAGNOSIS — E063 Autoimmune thyroiditis: Secondary | ICD-10-CM | POA: Diagnosis not present

## 2021-10-12 ENCOUNTER — Ambulatory Visit (INDEPENDENT_AMBULATORY_CARE_PROVIDER_SITE_OTHER): Payer: Medicare Other | Admitting: Neurology

## 2021-10-12 ENCOUNTER — Encounter: Payer: Self-pay | Admitting: Neurology

## 2021-10-12 VITALS — BP 186/88 | HR 68 | Ht 69.5 in | Wt 196.6 lb

## 2021-10-12 DIAGNOSIS — I10 Essential (primary) hypertension: Secondary | ICD-10-CM

## 2021-10-12 DIAGNOSIS — F02A0 Dementia in other diseases classified elsewhere, mild, without behavioral disturbance, psychotic disturbance, mood disturbance, and anxiety: Secondary | ICD-10-CM | POA: Diagnosis not present

## 2021-10-12 DIAGNOSIS — G301 Alzheimer's disease with late onset: Secondary | ICD-10-CM | POA: Diagnosis not present

## 2021-10-12 DIAGNOSIS — E039 Hypothyroidism, unspecified: Secondary | ICD-10-CM | POA: Insufficient documentation

## 2021-10-12 DIAGNOSIS — K5190 Ulcerative colitis, unspecified, without complications: Secondary | ICD-10-CM | POA: Insufficient documentation

## 2021-10-12 DIAGNOSIS — K8020 Calculus of gallbladder without cholecystitis without obstruction: Secondary | ICD-10-CM | POA: Insufficient documentation

## 2021-10-12 DIAGNOSIS — M4802 Spinal stenosis, cervical region: Secondary | ICD-10-CM | POA: Insufficient documentation

## 2021-10-12 DIAGNOSIS — M79643 Pain in unspecified hand: Secondary | ICD-10-CM | POA: Insufficient documentation

## 2021-10-12 DIAGNOSIS — G5600 Carpal tunnel syndrome, unspecified upper limb: Secondary | ICD-10-CM | POA: Insufficient documentation

## 2021-10-12 MED ORDER — ASPIRIN EC 81 MG PO TBEC: 81.0000 mg | DELAYED_RELEASE_TABLET | Freq: Every day | ORAL | 11 refills | Status: DC

## 2021-10-12 MED ORDER — DONEPEZIL HCL 5 MG PO TABS: 5.0000 mg | ORAL_TABLET | Freq: Every day | ORAL | 11 refills | Status: DC

## 2021-10-12 NOTE — Patient Instructions (Addendum)
Start with Aricept 5 mg nightly  ?Start with baby aspirin 81 mg  ?Continue your other medications  ?Follow up in 1 year  ? ? ? ?There are well-accepted and sensible ways to reduce risk for Alzheimers disease and other degenerative brain disorders . ? ?Exercise Daily Walk A daily 20 minute walk should be part of your routine. Disease related apathy can be a significant roadblock to exercise and the only way to overcome this is to make it a daily routine and perhaps have a reward at the end (something your loved one loves to eat or drink perhaps) or a personal trainer coming to the home can also be very useful. Most importantly, the patient is much more likely to exercise if the caregiver / spouse does it with him/her. In general a structured, repetitive schedule is best. ? ?General Health: Any diseases which effect your body will effect your brain such as a pneumonia, urinary infection, blood clot, heart attack or stroke. Keep contact with your primary care doctor for regular follow ups. ? ?Sleep. A good nights sleep is healthy for the brain. Seven hours is recommended. If you have insomnia or poor sleep habits we can give you some instructions. If you have sleep apnea wear your mask. ? ?Diet: Eating a heart healthy diet is also a good idea; fish and poultry instead of red meat, nuts (mostly non-peanuts), vegetables, fruits, olive oil or canola oil (instead of butter), minimal salt (use other spices to flavor foods), whole grain rice, bread, cereal and pasta and wine in moderation.Research is now showing that the MIND diet, which is a combination of The Mediterranean diet and the DASH diet, is beneficial for cognitive processing and longevity. Information about this diet can be found in The MIND Diet, a book by Doyne Keel, MS, RDN, and online at [employer]bookDist[employer].si ? ?Finances, Power of Producer, television/film/video Directives: You should consider putting legal safeguards in place with regard to  financial and medical decision making. While the spouse always has power of attorney for medical and financial issues in the absence of any form, you should consider what you want in case the spouse / caregiver is no longer around or capable of making decisions.  ? ? ? ?Heart-head connection ? ?New research shows there are things we can do to reduce the risk of mild cognitive impairment and dementia. ? ?Several conditions known to increase the risk of cardiovascular disease -- such as high blood pressure, diabetes and high cholesterol -- also increase the risk of developing Alzheimer's. Some autopsy studies show that as many as 26 percent of individuals with Alzheimer's disease also have cardiovascular disease. ? ?A longstanding question is why some people develop hallmark Alzheimer's plaques and tangles but do not develop the symptoms of Alzheimer's. Vascular disease may help researchers eventually find an answer. Some autopsy studies suggest that plaques and tangles may be present in the brain without causing symptoms of cognitive decline unless the brain also shows evidence of vascular disease. More research is needed to better understand the link between vascular health and Alzheimer's. ? ?Physical exercise and diet ?Regular physical exercise may be a beneficial strategy to lower the risk of Alzheimer's and vascular dementia. Exercise may directly benefit brain cells by increasing blood and oxygen flow in the brain. Because of its known cardiovascular benefits, a medically approved exercise program is a valuable part of any overall wellness plan. ? ?Current evidence suggests that heart-healthy eating may also help protect the brain. Heart-healthy eating  includes limiting the intake of sugar and saturated fats and making sure to eat plenty of fruits, vegetables, and whole grains. No one diet is best. Two diets that have been studied and may be beneficial are the DASH (Dietary Approaches to Stop Hypertension) diet  and the Mediterranean diet. The DASH diet emphasizes vegetables, fruits and fat-free or low-fat dairy products; includes whole grains, fish, poultry, beans, seeds, nuts and vegetable oils; and limits sodium, sweets, sugary beverages and red meats. A Mediterranean diet includes relatively little red meat and emphasizes whole grains, fruits and vegetables, fish and shellfish, and nuts, olive oil and other healthy fats. ? ?Social connections and intellectual activity ?A number of studies indicate that maintaining strong social connections and keeping mentally active as we age might lower the risk of cognitive decline and Alzheimer's. Experts are not certain about the reason for this association. It may be due to direct mechanisms through which social and mental stimulation strengthen connections between nerve cells in the brain. ? ?Head trauma ?There appears to be a strong link between future risk of Alzheimer's and serious head trauma, especially when injury involves loss of consciousness. You can help reduce your risk of Alzheimer's by protecting your head. ? Wear a seat belt ? Use a helmet when participating in sports ? "Fall-proof" your home ?  ?What you can do now ?While research is not yet conclusive, certain lifestyle choices, such as physical activity and diet, may help support brain health and prevent Alzheimer's. Many of these lifestyle changes have been shown to lower the risk of other diseases, like heart disease and diabetes, which have been linked to Alzheimer's. With few drawbacks and plenty of known benefits, healthy lifestyle choices can improve your health and possibly protect your brain. ? ?Learn more about brain health. ?You can help increase our knowledge by considering participation in a clinical study. Our free clinical trial matching services, TrialMatch?, can help you find clinical trials in your area that are seeking volunteers. ? ?  ?   ?

## 2021-10-12 NOTE — Progress Notes (Signed)
? ?[facility] ? ?PATIENT: Devin [name] ?DOB: Aug 07, 1940 ? ?REQUESTING CLINICIAN: Angelina Sheriff, MD ?HISTORY FROM: Patient and spouse  ?REASON FOR VISIT: Memory problems  ? ? ?HISTORICAL ? ?CHIEF COMPLAINT:  ?Chief Complaint  ?Patient presents with  ? New Patient (Initial Visit)  ?  Rm 17. Accompanied by wife, Devin [name]. ?NP/paper proficient/[name]. Phys./Memory concerns. ?Review CT.  ? ? ?HISTORY OF PRESENT ILLNESS:  ?This is a 81 year old gentleman with past medical history of hypothyroidism, hypertension, arthritis who is presenting with his wife for memory problem.  Patient reported that he thinks that his memory is not what he used to be.  He does notice that he had some trouble remembering recent events.  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   ?Per wife, memory decline starting 2 to 3 years, and now getting worse.  He asked the same questions repeatedly, sometimes he will repeat himself also.  ?Wife also reports an incident that happened 6 weeks ago where patient was unable to get up while in church.  At that time, he also appeared confused.  Wife reported the night before, she gave him some sleeping pills which was later discontinued by his primary care doctor, she does not remember the name.  6 days after the event he did tested positive for COVID infection. ? ? ?TBI:   No past history of TBI ?Stroke:   no past history of stroke ?Seizures:   no past history of seizures ?Sleep:    no history of sleep apnea but reports he does not sleep well.  ?Mood:  patient denies anxiety and depression ? ?Functional status: independent in all ADLs  ?Patient lives with spouse   ?Cooking: Wife  ?Cleaning: Wife  ?Shopping: Wife  ?Bathing: Patient  ?Toileting: Patient  ?Driving: Drive ?Bills: Wife  ?Ever left the stove on by accident?: NO  ?Forget how to use items around the house?: No  ?Getting lost going to familiar places?: No  ?Forgetting loved ones names?:  No  ?Word finding difficulty? No  ?Sleep: Report trouble with sleeping  ? ? ?OTHER MEDICAL CONDITIONS: CTS, arthritis, Hypertension, hypothyroidism ? ? ?REVIEW OF SYSTEMS: Full 14 system review of systems performed and negative with exception of: as noted in the HPI  ? ?ALLERGIES: ?No Known Allergies ? ?HOME MEDICATIONS: ?Outpatient Medications Prior to Visit  ?Medication Sig Dispense Refill  ? Doxepin HCl 3 MG TABS Take 1 tablet by mouth at bedtime.    ? losartan (COZAAR) 100 MG tablet Take 100 mg by mouth daily.    ? Multiple Vitamin (MULTIVITAMIN) capsule Take 1 capsule by mouth daily.    ? pregabalin (LYRICA) 50 MG capsule Take by mouth.    ? sulfaSALAzine (AZULFIDINE) 500 MG tablet Take 2 tablets (1,000 mg total) by mouth 2 (two) times daily. 360 tablet 4  ? SYNTHROID 112 MCG tablet Take 1 tablet (112 mcg total) by mouth daily before breakfast. 90 tablet 3  ? tamsulosin (FLOMAX) 0.4 MG CAPS capsule Take 0.4 mg by mouth every morning.    ? zolpidem (AMBIEN) 5 MG tablet Take 5 mg by mouth at bedtime as needed.    ? ?No facility-administered medications prior to visit.  ? ? ?PAST MEDICAL HISTORY: ?Past Medical History:  ?Diagnosis Date  ? Carpal tunnel syndrome   ? right hand  ? GERD (gastroesophageal reflux disease)   ? History of diverticulitis   ? Hypothyroidism   ? Left sided  ulcerative colitis with rectal bleeding ([place])   ? Thyroid cancer ([place]) 1997  ? ? ?PAST SURGICAL HISTORY: ?Past Surgical History:  ?Procedure Laterality Date  ? ANTERIOR CERVICAL DECOMP/DISCECTOMY FUSION N/A 09/23/2020  ? Procedure: Anterior Cervical Discectomy Fusion - Cervical Three-Cervical Four;  Surgeon: Eustace Moore, MD;  Location: [place] [facility];  Service: Neurosurgery;  Laterality: N/A;  Anterior Cervical Discectomy Fusion - Cervical Three-Cervical Four  ? CARPAL TUNNEL RELEASE Right 09/23/2020  ? Procedure: Right carpal tunnel release;  Surgeon: Eustace Moore, MD;  Location: [facility];  Service: Neurosurgery;  Laterality: Right;  3C  ?  CHOLECYSTECTOMY    ? per patient "maybe about 15 years ago"  ? COLONOSCOPY  09/06/2014  ? Left sided ulcerative colitis up to 55cm. Normal remaining colon. Normal TI. Mild pancolonic diverticulosis. Status post sigmoid resection with patent colo-colic anastomosis.   ? ESOPHAGOGASTRODUODENOSCOPY    ? Dr Melina Copa. Years ago per patient   ? [place] or 1997  ? right leg per patient   ? LOW ANTERIOR BOWEL RESECTION    ? MOUTH SURGERY    ? ROTATOR CUFF REPAIR    ? THYROIDECTOMY    ? TONSILLECTOMY    ? per patient "as a youngster"  ? ? ?FAMILY HISTORY: ?Family History  ?Problem Relation Age of Onset  ? Colon cancer Neg Hx   ? Esophageal cancer Neg Hx   ? Pancreatic cancer Neg Hx   ? Stomach cancer Neg Hx   ? Liver disease Neg Hx   ? ? ?SOCIAL HISTORY: ?Social History  ? ?Socioeconomic History  ? Marital status: Married  ?  Spouse name: Not on file  ? Number of children: 2  ? Years of education: Not on file  ? Highest education level: Not on file  ?Occupational History  ? Not on file  ?Tobacco Use  ? Smoking status: Never  ? Smokeless tobacco: Never  ? Tobacco comments:  ?  said he smoked apack in his life as a  teen  ?Vaping Use  ? Vaping Use: Never used  ?Substance and Sexual Activity  ? Alcohol use: Not Currently  ? Drug use: Never  ? Sexual activity: Not on file  ?Other Topics Concern  ? Not on file  ?Social History Narrative  ? Not on file  ? ?Social Determinants of Health  ? ?Financial Resource Strain: Not on file  ?Food Insecurity: Not on file  ?Transportation Needs: Not on file  ?Physical Activity: Not on file  ?Stress: Not on file  ?Social Connections: Not on file  ?Intimate Partner Violence: Not on file  ? ? ?PHYSICAL EXAM ? ?GENERAL EXAM/CONSTITUTIONAL: ?Vitals:  ?Vitals:  ? 10/12/21 1422 10/12/21 1433  ?BP: (!) 190/85 (!) 186/88  ?Pulse: 69 68  ?Weight: 196 lb 9.6 oz (89.2 kg)   ?Height: 5' 9.5" (1.765 m)   ? ?Body mass index is 28.62 kg/m?. ?Wt Readings from Last 3 Encounters:  ?10/12/21 196 lb 9.6  oz (89.2 kg)  ?09/27/21 194 lb 3.2 oz (88.1 kg)  ?04/26/21 196 lb (88.9 kg)  ? ?Patient is in no distress; well developed, nourished and groomed; neck is supple ? ?EYES: ?Pupils round and reactive to light, Visual fields full to confrontation, Extraocular movements intacts,  ? ?MUSCULOSKELETAL: ?Gait, strength, tone, movements noted in Neurologic exam below ? ?NEUROLOGIC: ?MENTAL STATUS:  ?   ? View : No data to display.  ?  ?  ?  ? ?awake, alert, oriented to person, place  and time ?recent and remote memory intact ?normal attention and concentration ?language fluent, comprehension intact, naming intact ?fund of knowledge appropriate ? ? ?  10/12/2021  ?  2:28 PM  ?Montreal Cognitive Assessment   ?Visuospatial/ Executive (0/5) 5  ?Naming (0/3) 1  ?Attention: Read list of digits (0/2) 2  ?Attention: Read list of letters (0/1) 1  ?Attention: Serial 7 subtraction starting at 100 (0/3) 3  ?Language: Repeat phrase (0/2) 2  ?Language : Fluency (0/1) 1  ?Abstraction (0/2) 1  ?Delayed Recall (0/5) 0  ?Orientation (0/6) 6  ?Total 22  ?Adjusted Score (based on education) 22  ? ? ? ?CRANIAL NERVE:  ?2nd, 3rd, 4th, 6th - pupils equal and reactive to light, visual fields full to confrontation, extraocular muscles intact, no nystagmus ?5th - facial sensation symmetric ?7th - facial strength symmetric ?8th - hearing intact ?9th - palate elevates symmetrically, uvula midline ?11th - shoulder shrug symmetric ?12th - tongue protrusion midline ? ?MOTOR:  ?normal bulk and tone, full strength in the BUE, BLE ? ?SENSORY:  ?normal and symmetric to light touch, vibration ? ?COORDINATION:  ?finger-nose-finger, fine finger movements normal ? ?REFLEXES:  ?deep tendon reflexes present and symmetric ? ?GAIT/STATION:  ?normal ? ?DIAGNOSTIC DATA (LABS, IMAGING, TESTING) ?- I reviewed patient records, labs, notes, testing and imaging myself where available. ? ?Lab Results  ?Component Value Date  ? WBC 6.6 09/27/2021  ? HGB 11.4 (A) 09/27/2021  ? HCT  36 (A) 09/27/2021  ? MCV 87.2 09/21/2020  ? PLT 259 09/27/2021  ? ?   ?Component Value Date/Time  ? NA 141 09/27/2021 0000  ? K 4.0 09/27/2021 0000  ? CL 108 09/27/2021 0000  ? CO2 27 (A) 09/27/2021 0000

## 2021-12-25 ENCOUNTER — Telehealth: Payer: Self-pay | Admitting: Neurology

## 2021-12-25 NOTE — Telephone Encounter (Signed)
I think providing his medical record is more complete with all the objective findings.

## 2021-12-25 NOTE — Telephone Encounter (Signed)
Left message that we can provide medical records (ok per DPR). They will be more detailed. The letter would just be a summary. We will need a signed release form. We will send this to Devin [name] so that the records can be printed and ready for pick up.

## 2021-12-25 NOTE — Telephone Encounter (Signed)
Wife is calling and requesting a letter of Pt  diagnosis for legal matters.   Imagene Gurney)

## 2022-01-24 ENCOUNTER — Inpatient Hospital Stay (INDEPENDENT_AMBULATORY_CARE_PROVIDER_SITE_OTHER): Payer: Medicare Other | Admitting: Hematology and Oncology

## 2022-01-24 ENCOUNTER — Encounter: Payer: Self-pay | Admitting: Hematology and Oncology

## 2022-01-24 ENCOUNTER — Inpatient Hospital Stay: Payer: Medicare Other | Attending: Hematology and Oncology

## 2022-01-24 DIAGNOSIS — Z79890 Hormone replacement therapy (postmenopausal): Secondary | ICD-10-CM | POA: Diagnosis not present

## 2022-01-24 DIAGNOSIS — Z7982 Long term (current) use of aspirin: Secondary | ICD-10-CM | POA: Insufficient documentation

## 2022-01-24 DIAGNOSIS — C73 Malignant neoplasm of thyroid gland: Secondary | ICD-10-CM

## 2022-01-24 DIAGNOSIS — D649 Anemia, unspecified: Secondary | ICD-10-CM | POA: Insufficient documentation

## 2022-01-24 DIAGNOSIS — Z85850 Personal history of malignant neoplasm of thyroid: Secondary | ICD-10-CM | POA: Diagnosis not present

## 2022-01-24 DIAGNOSIS — Z79899 Other long term (current) drug therapy: Secondary | ICD-10-CM | POA: Insufficient documentation

## 2022-01-24 LAB — CBC AND DIFFERENTIAL
HCT: 35 — AB (ref 41–53)
Hemoglobin: 11.8 — AB (ref 13.5–17.5)
Neutrophils Absolute: 3.98
Platelets: 240 10*3/uL (ref 150–400)
WBC: 7.1

## 2022-01-24 LAB — BASIC METABOLIC PANEL
BUN: 25 — AB (ref 4–21)
CO2: 24 — AB (ref 13–22)
Chloride: 111 — AB (ref 99–108)
Creatinine: 0.9 (ref 0.6–1.3)
Glucose: 107
Potassium: 3.9 mEq/L (ref 3.5–5.1)
Sodium: 143 (ref 137–147)

## 2022-01-24 LAB — COMPREHENSIVE METABOLIC PANEL
Albumin: 3.8 (ref 3.5–5.0)
Calcium: 8.7 (ref 8.7–10.7)

## 2022-01-24 LAB — HEPATIC FUNCTION PANEL
ALT: 22 U/L (ref 10–40)
AST: 34 (ref 14–40)
Alkaline Phosphatase: 130 — AB (ref 25–125)
Bilirubin, Total: 0.6

## 2022-01-24 LAB — TSH: TSH: 2.517 u[IU]/mL (ref 0.350–4.500)

## 2022-01-24 LAB — CBC: RBC: 4.01 (ref 3.87–5.11)

## 2022-01-24 NOTE — Assessment & Plan Note (Signed)
Remote history of thyroid cancer diagnosed in 1997, treated with surgery and iodine 131. He remains without evidence of recurrence.  He will continue on levothyroxine 125 mcg daily pending the thyroid tests from today.

## 2022-01-24 NOTE — Progress Notes (Signed)
Patient Care Team: Earlyne Iba, NP as PCP - General (Nurse Practitioner) Mordecai Rasmussen, MD as Referring Physician (Surgery) Callie Fielding, NP as Nurse Practitioner (Nurse Practitioner) Derwood Kaplan, MD as Consulting Physician (Oncology)  Clinic Day:  01/24/2022  Referring physician: Penelope Coop, FNP  ASSESSMENT & PLAN:   Assessment & Plan: Thyroid cancer [facility]) Remote history of thyroid cancer diagnosed in 1997, treated with surgery and iodine 131. He remains without evidence of recurrence.  He will continue on levothyroxine 125 mcg daily pending the thyroid tests from today.  Anemia Anemia, worsening, but now better today and back to his baseline.  This has slowly worsened over time and his hemoglobin was 12.9 back in November 2020.  Previous evaluation from July 2022 did not reveal a specific etiology. Iron studies, B12 and folate were all unremarkable.  Protein electrophoresis and rheumatoid factor are pending.  However, there was an increase in monocytes which brings up the likely possibility of early myelodysplasia. This is slightly improved today.  Since it is so mild, we do not think we need to pursue a bone marrow at this time.  This could also represent intermittent bleeding from his ulcerative colitis and/or polyps.  The other possibility would be his sulfasalazine.  He will be due for repeat colonoscopy within the next year. He will return to clinic in 4 months for repeat evaluation.    The patient understands the plans discussed today and is in agreement with them.  He knows to contact our office if he develops concerns prior to his next appointment.    Melodye Ped, NP  [facility] [address] [place] [address] Dept: [phone] Dept Fax: [phone]   Orders Placed This Encounter  Procedures   CBC and differential    This external order was created through the Results  Console.   CBC    This external order was created through the Results Console.   Basic metabolic panel    This external order was created through the Results Console.   Comprehensive metabolic panel    This external order was created through the Results Console.   Hepatic function panel    This external order was created through the Results Console.      CHIEF COMPLAINT:  CC: An 81 year old male with history of anemia here for 4 month evaluation  Current Treatment:  Surveillance  INTERVAL HISTORY:  Devin [name] is here today for repeat clinical assessment. He denies fevers or chills. He denies pain. His appetite is good. His weight has increased 7 pounds since last visit.  I have reviewed the past medical history, past surgical history, social history and family history with the patient and they are unchanged from previous note.  ALLERGIES:  has No Known Allergies.  MEDICATIONS:  Current Outpatient Medications  Medication Sig Dispense Refill   losartan (COZAAR) 100 MG tablet Take by mouth.     aspirin EC 81 MG tablet Take 1 tablet (81 mg total) by mouth daily. Swallow whole. 30 tablet 11   donepezil (ARICEPT) 5 MG tablet Take 1 tablet (5 mg total) by mouth at bedtime. 30 tablet 11   Multiple Vitamin (MULTIVITAMIN) capsule Take 1 capsule by mouth daily.     sulfaSALAzine (AZULFIDINE) 500 MG tablet Take 2 tablets (1,000 mg total) by mouth 2 (two) times daily. 360 tablet 4   SYNTHROID 112 MCG tablet Take 1 tablet (112 mcg total) by  mouth daily before breakfast. 90 tablet 3   No current facility-administered medications for this visit.    HISTORY OF PRESENT ILLNESS:   Oncology History   No history exists.      REVIEW OF SYSTEMS:   Constitutional: Denies fevers, chills or abnormal weight loss Eyes: Denies blurriness of vision Ears, nose, mouth, throat, and face: Denies mucositis or sore throat Respiratory: Denies cough, dyspnea or wheezes Cardiovascular: Denies palpitation,  chest discomfort or lower extremity swelling Gastrointestinal:  Denies nausea, heartburn or change in bowel habits Skin: Denies abnormal skin rashes Lymphatics: Denies new lymphadenopathy or easy bruising Neurological:Denies numbness, tingling or new weaknesses Behavioral/Psych: Mood is stable, no new changes  All other systems were reviewed with the patient and are negative.   VITALS:  Blood pressure (!) 163/74, pulse 62, temperature 98.1 F (36.7 C), temperature source Oral, resp. rate 20, height 5' 9.5" (1.765 m), weight 203 lb 8 oz (92.3 kg), SpO2 98 %.  Wt Readings from Last 3 Encounters:  01/24/22 203 lb 8 oz (92.3 kg)  10/12/21 196 lb 9.6 oz (89.2 kg)  09/27/21 194 lb 3.2 oz (88.1 kg)    Body mass index is 29.62 kg/m.  Performance status (ECOG): 1 - Symptomatic but completely ambulatory  PHYSICAL EXAM:   GENERAL:alert, no distress and comfortable SKIN: skin color, texture, turgor are normal, no rashes or significant lesions EYES: normal, Conjunctiva are pink and non-injected, sclera clear OROPHARYNX:no exudate, no erythema and lips, buccal mucosa, and tongue normal  NECK: supple, thyroid normal size, non-tender, without nodularity LYMPH:  no palpable lymphadenopathy in the cervical, axillary or inguinal LUNGS: clear to auscultation and percussion with normal breathing effort HEART: regular rate & rhythm and no murmurs and no lower extremity edema ABDOMEN:abdomen soft, non-tender and normal bowel sounds Musculoskeletal:no cyanosis of digits and no clubbing  NEURO: alert & oriented x 3 with fluent speech, no focal motor/sensory deficits  LABORATORY DATA:  I have reviewed the data as listed    Component Value Date/Time   NA 143 01/24/2022 0000   K 3.9 01/24/2022 0000   CL 111 (A) 01/24/2022 0000   CO2 24 (A) 01/24/2022 0000   GLUCOSE 116 (H) 09/21/2020 1138   BUN 25 (A) 01/24/2022 0000   CREATININE 0.9 01/24/2022 0000   CREATININE 0.87 09/21/2020 1138   CALCIUM 8.7  01/24/2022 0000   ALBUMIN 3.8 01/24/2022 0000   AST 34 01/24/2022 0000   ALT 22 01/24/2022 0000   ALKPHOS 130 (A) 01/24/2022 0000   GFRNONAA >60 09/21/2020 1138    No results found for: "SPEP", "UPEP"  Lab Results  Component Value Date   WBC 7.1 01/24/2022   NEUTROABS 3.98 01/24/2022   HGB 11.8 (A) 01/24/2022   HCT 35 (A) 01/24/2022   MCV 87.2 09/21/2020   PLT 240 01/24/2022      Chemistry      Component Value Date/Time   NA 143 01/24/2022 0000   K 3.9 01/24/2022 0000   CL 111 (A) 01/24/2022 0000   CO2 24 (A) 01/24/2022 0000   BUN 25 (A) 01/24/2022 0000   CREATININE 0.9 01/24/2022 0000   CREATININE 0.87 09/21/2020 1138   GLU 107 01/24/2022 0000      Component Value Date/Time   CALCIUM 8.7 01/24/2022 0000   ALKPHOS 130 (A) 01/24/2022 0000   AST 34 01/24/2022 0000   ALT 22 01/24/2022 0000       RADIOGRAPHIC STUDIES: I have personally reviewed the radiological images as listed and agreed  with the findings in the report. No results found.

## 2022-01-24 NOTE — Assessment & Plan Note (Signed)
Anemia, worsening, but now better today and back to his baseline.  This has slowly worsened over time and his hemoglobin was 12.9 back in November 2020.  Previous evaluation from July 2022 did not reveal a specific etiology. Iron studies, B12 and folate were all unremarkable.  Protein electrophoresis and rheumatoid factor are pending.  However, there was an increase in monocytes which brings up the likely possibility of early myelodysplasia. This is slightly improved today.  Since it is so mild, we do not think we need to pursue a bone marrow at this time.  This could also represent intermittent bleeding from his ulcerative colitis and/or polyps.  The other possibility would be his sulfasalazine.  He will be due for repeat colonoscopy within the next year. He will return to clinic in 4 months for repeat evaluation.

## 2022-01-26 LAB — THYROGLOBULIN ANTIBODY: Thyroglobulin Antibody: <1 IU/mL (ref 0.0–0.9)

## 2022-01-31 LAB — THYROGLOBULIN LEVEL: Thyroglobulin: <2 ng/mL

## 2022-02-02 DIAGNOSIS — H40013 Open angle with borderline findings, low risk, bilateral: Secondary | ICD-10-CM | POA: Diagnosis not present

## 2022-02-08 ENCOUNTER — Telehealth: Payer: Self-pay

## 2022-02-08 NOTE — Telephone Encounter (Signed)
Pt wife [name]  questions if pt needs a Colonoscopy this year along with pt needing medication refills: Pt scheduled for an office visit with 04/10/2022 at 10:20 with Dr. Lyndel Safe: Address Provided: [name]  verbalized understanding with all questions answered.

## 2022-02-23 ENCOUNTER — Other Ambulatory Visit: Payer: Self-pay | Admitting: Gastroenterology

## 2022-02-25 IMAGING — CR DG CHEST 2V
2 series · 2 of 2 positions shown · non-contrast
Comparison: Chest x-ray 08/17/2016.

CLINICAL DATA: 79-year-old male under preoperative evaluation prior
to cervical and carpal tunnel surgeries.

EXAM:
CHEST - 2 VIEW

[w chest pa]
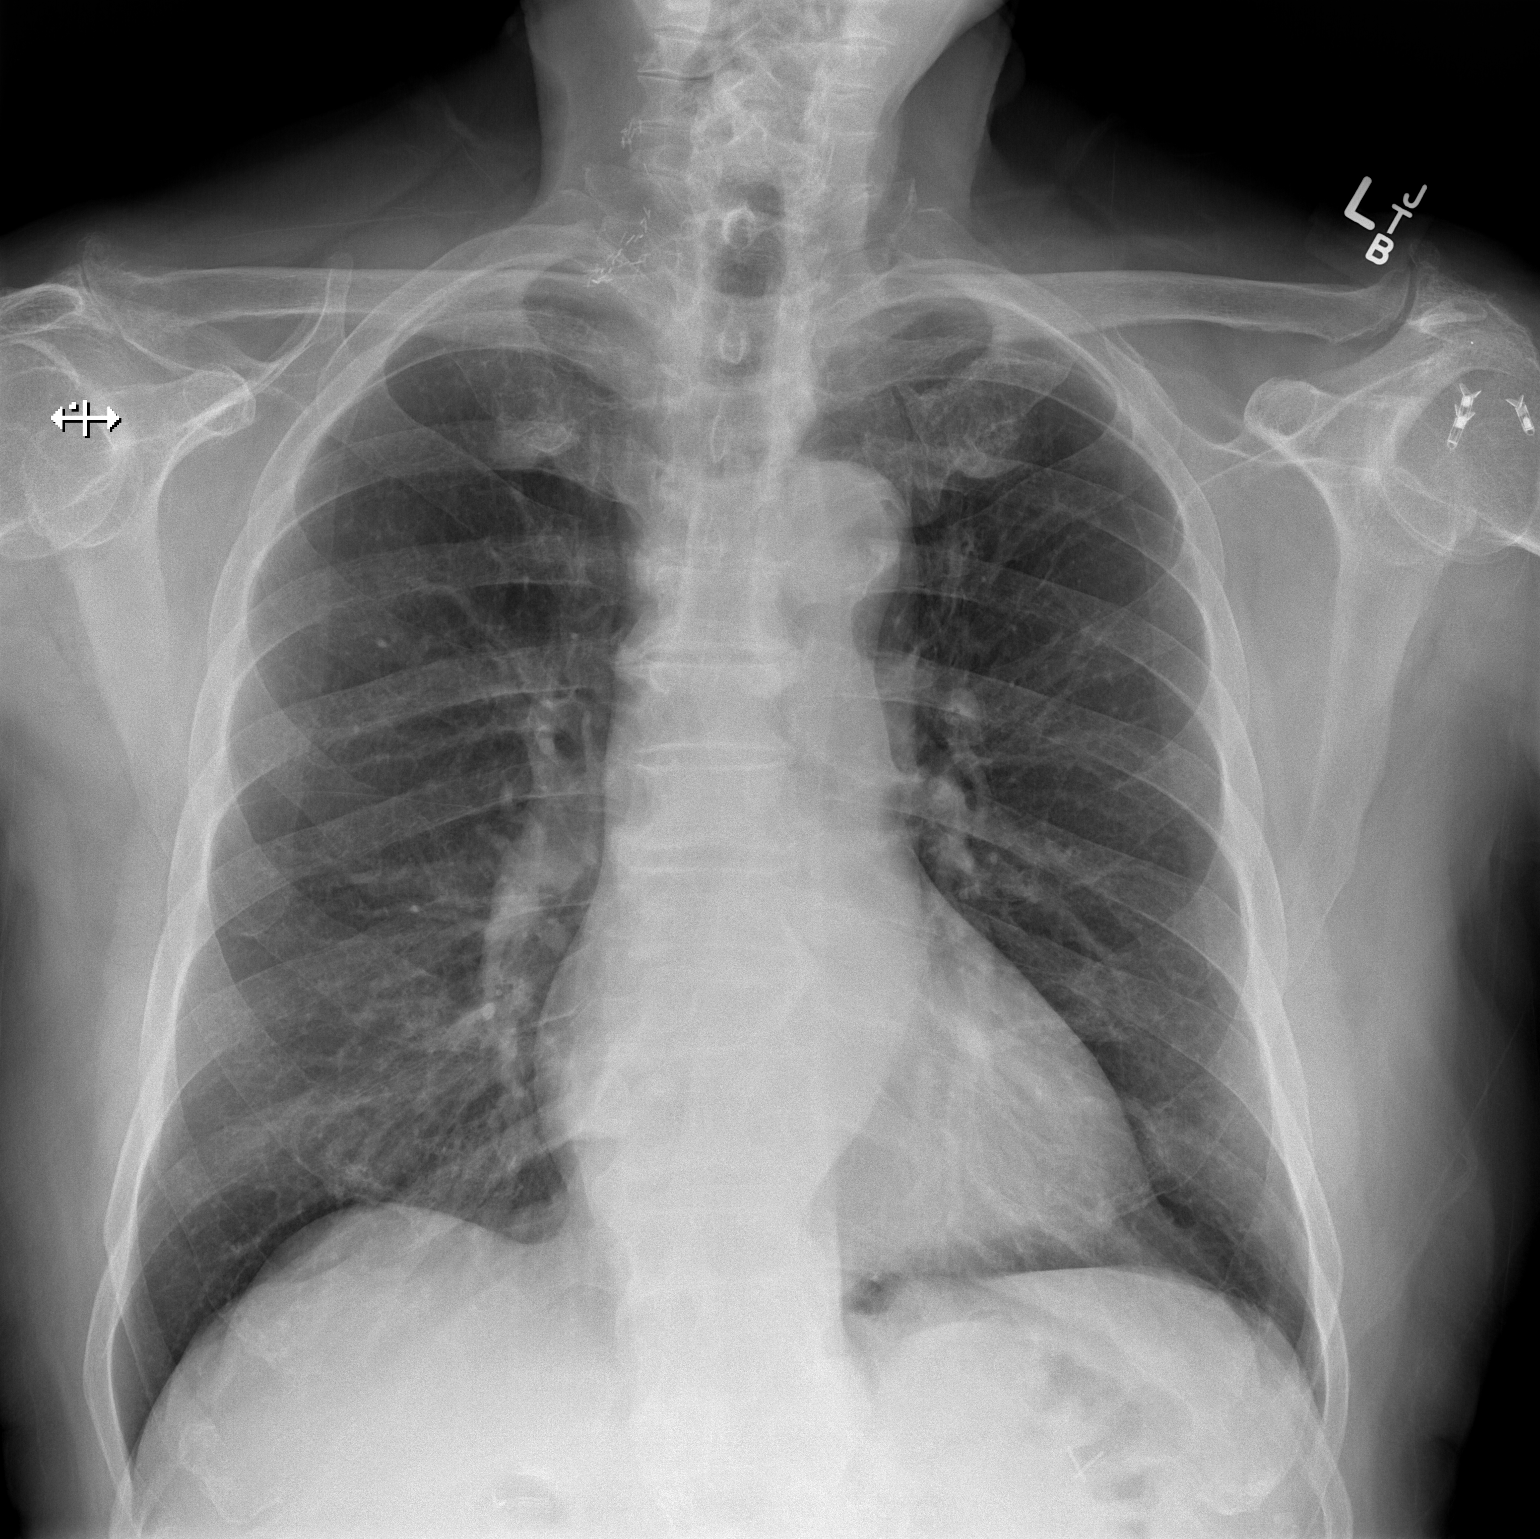

[w chest lat]
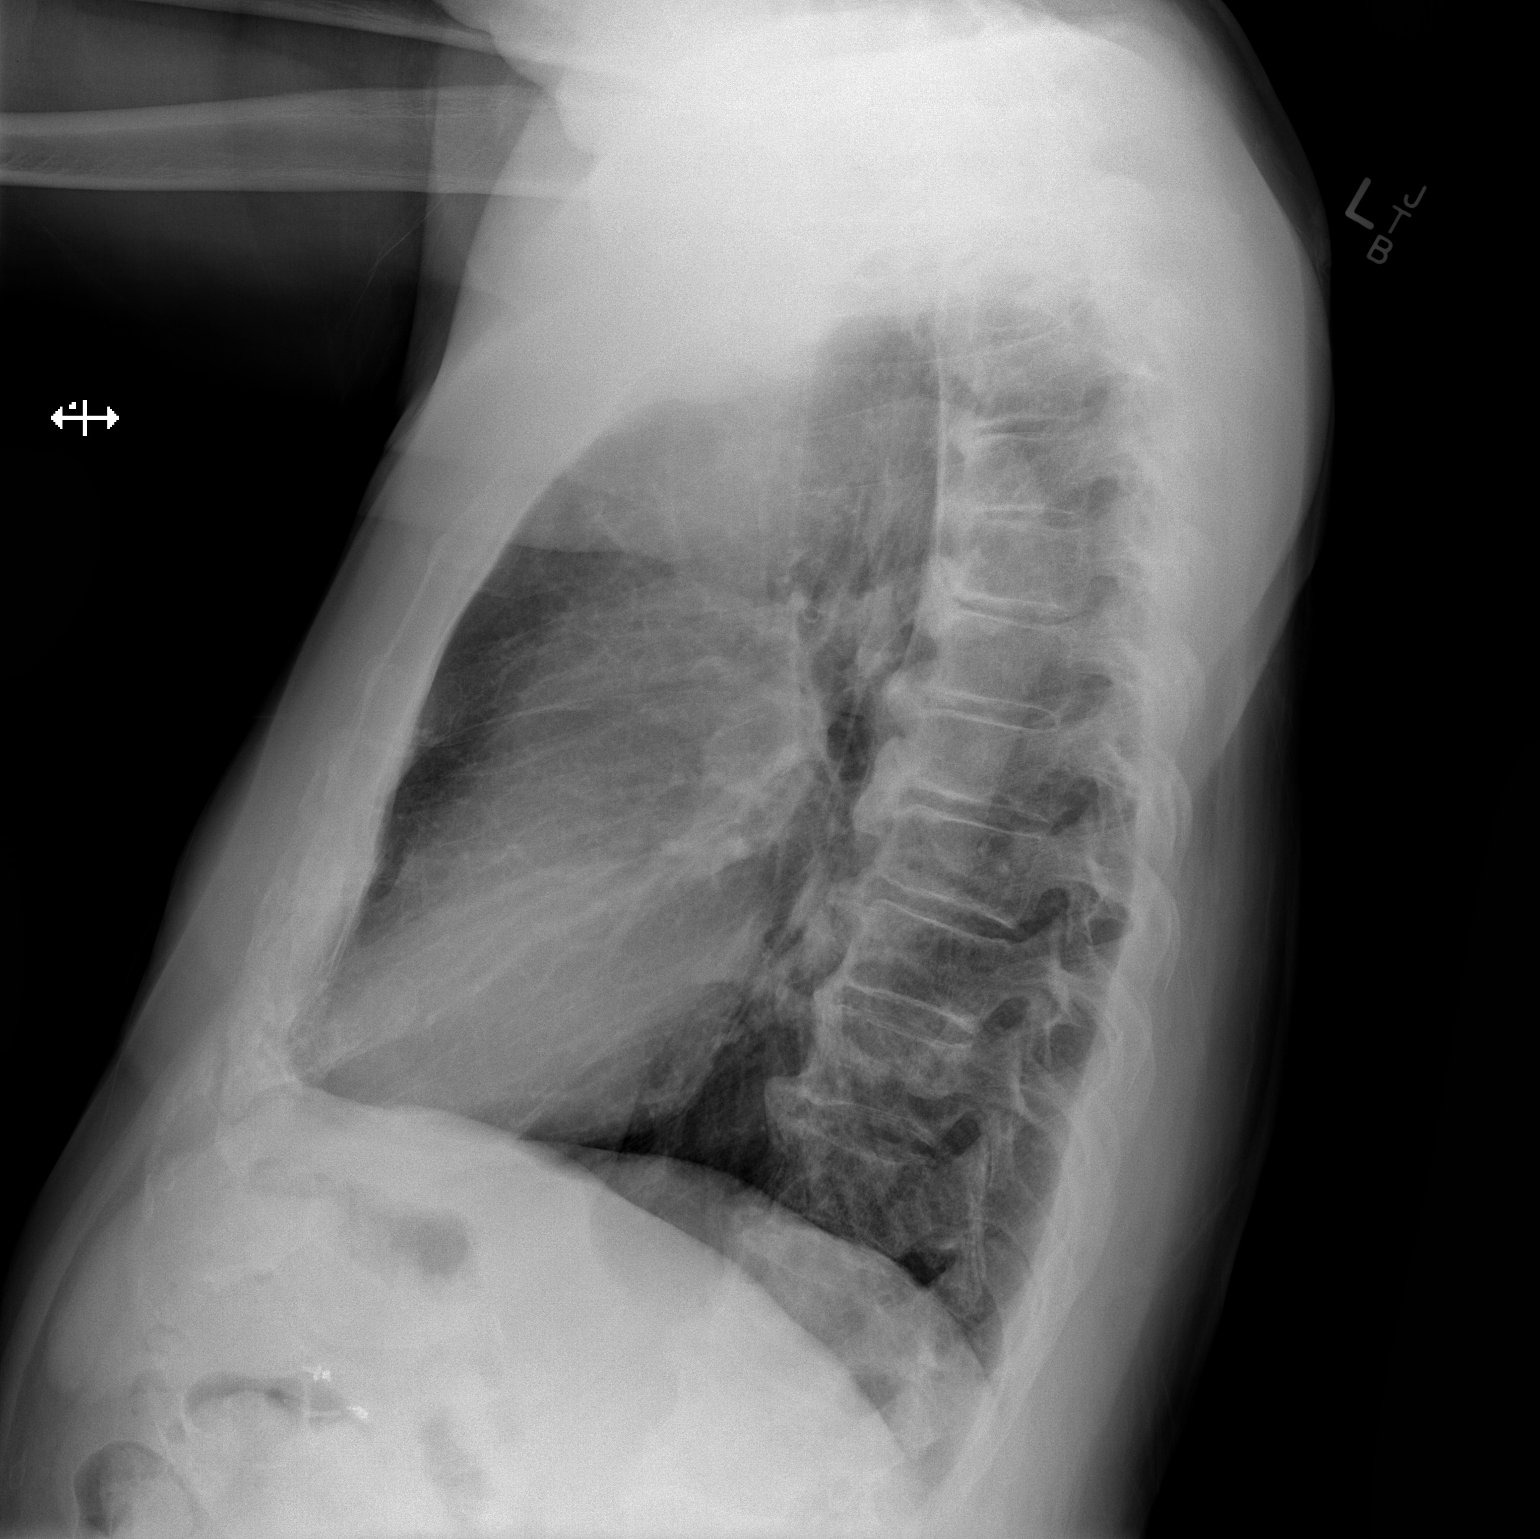

[2 of 2 positions shown; findings below may reference images not displayed]

FINDINGS: Lung volumes are normal. No consolidative airspace disease. No
pleural effusions. No pneumothorax. No pulmonary nodule or mass
noted. Pulmonary vasculature and the cardiomediastinal silhouette
are within normal limits. Atherosclerosis in the thoracic aorta.
Surgical clips project over the right upper quadrant of the abdomen,
likely from prior cholecystectomy. Soft tissue anchors in the left
proximal humerus, presumably from prior rotator cuff surgery.
IMPRESSION: 1.  No radiographic evidence of acute cardiopulmonary disease.
2. Aortic atherosclerosis.

## 2022-02-27 IMAGING — RF DG C-ARM 1-60 MIN
1 series · 4 of 4 positions shown · non-contrast
Comparison: None.

CLINICAL DATA: ACDF C3-C4.

EXAM:
CERVICAL SPINE - 2-3 VIEW; DG C-ARM 1-60 MIN

[Series 1: run · 4 of 4 slices shown]
[im 1/4]
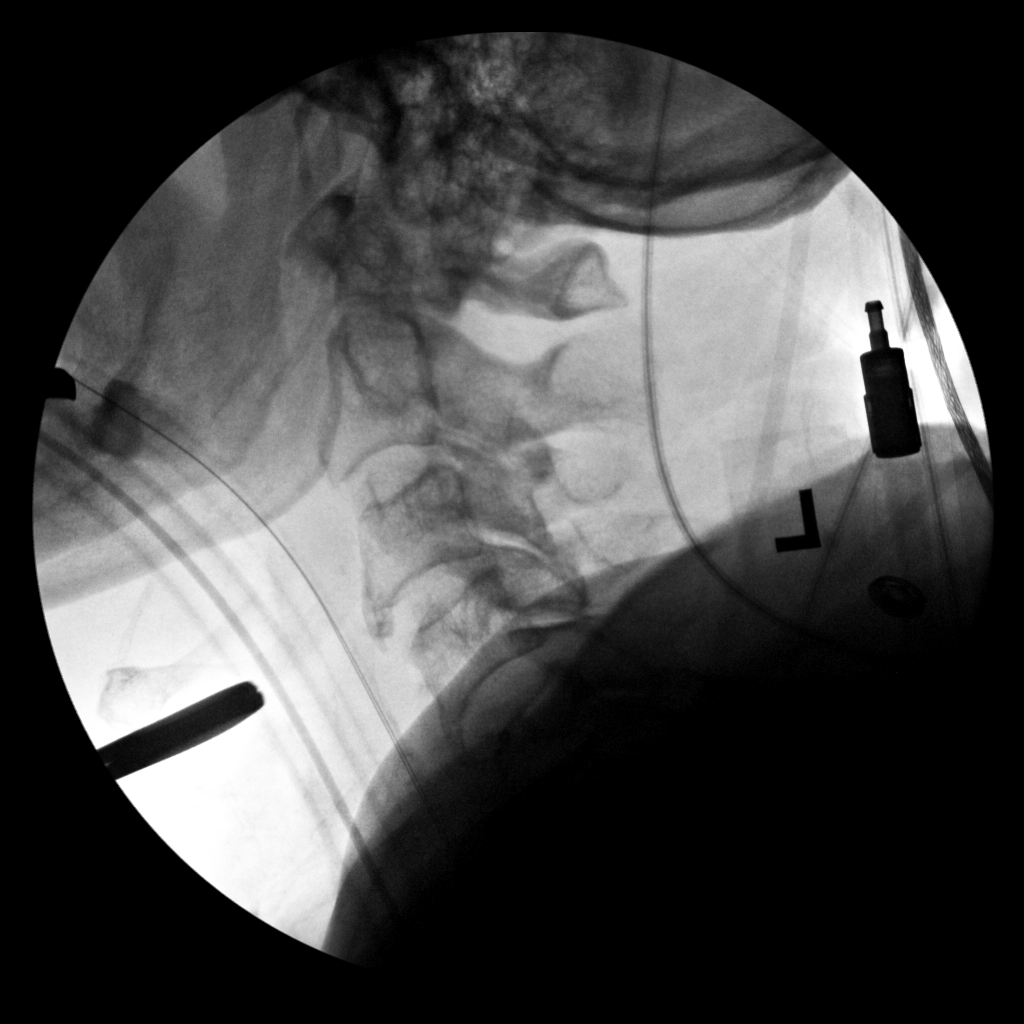
[im 2/4]
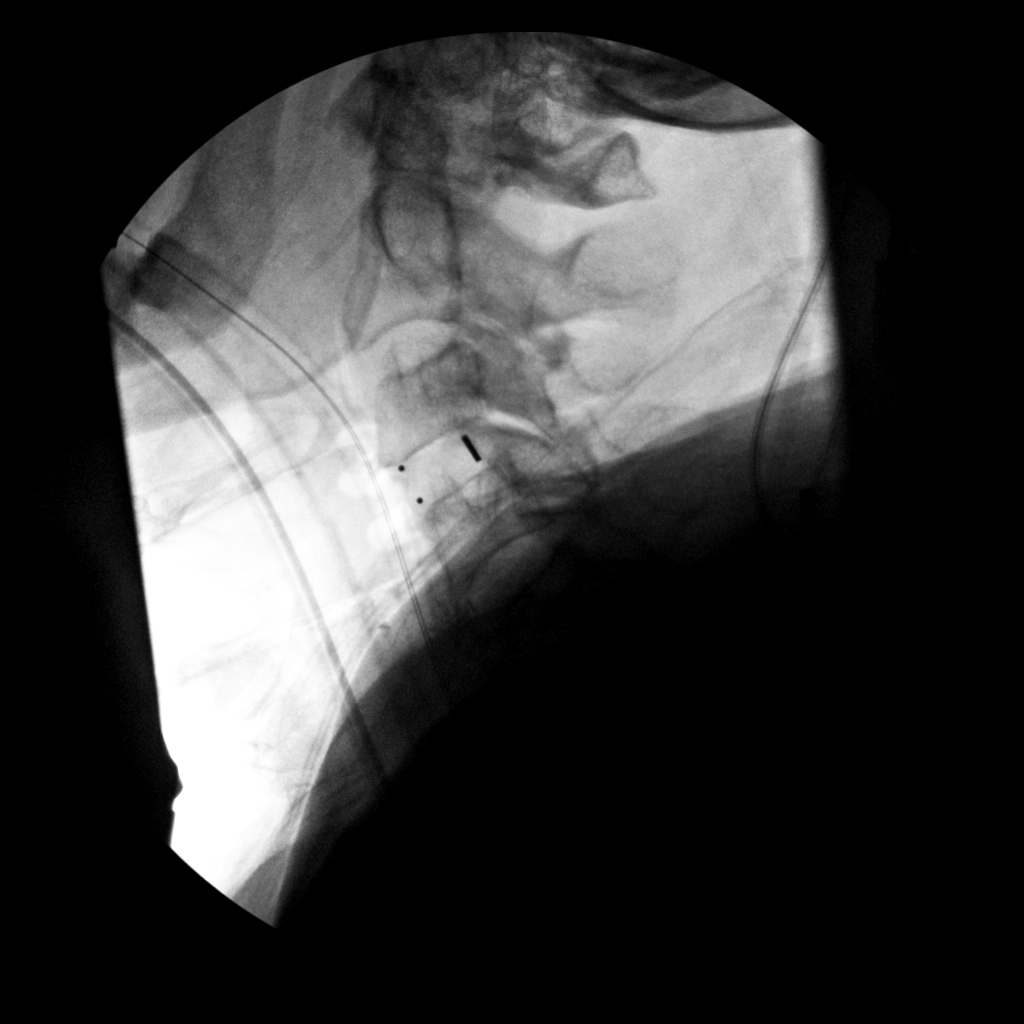
[im 3/4]
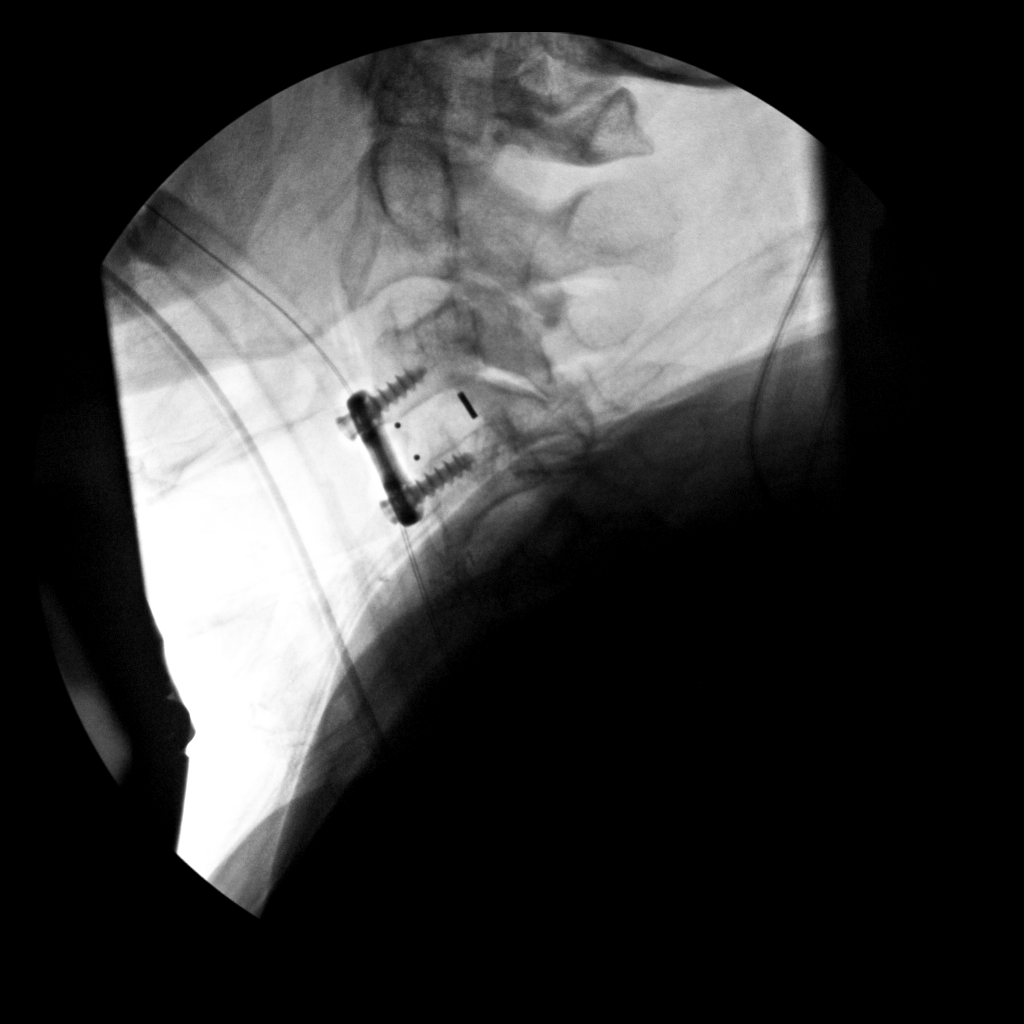
[im 4/4]
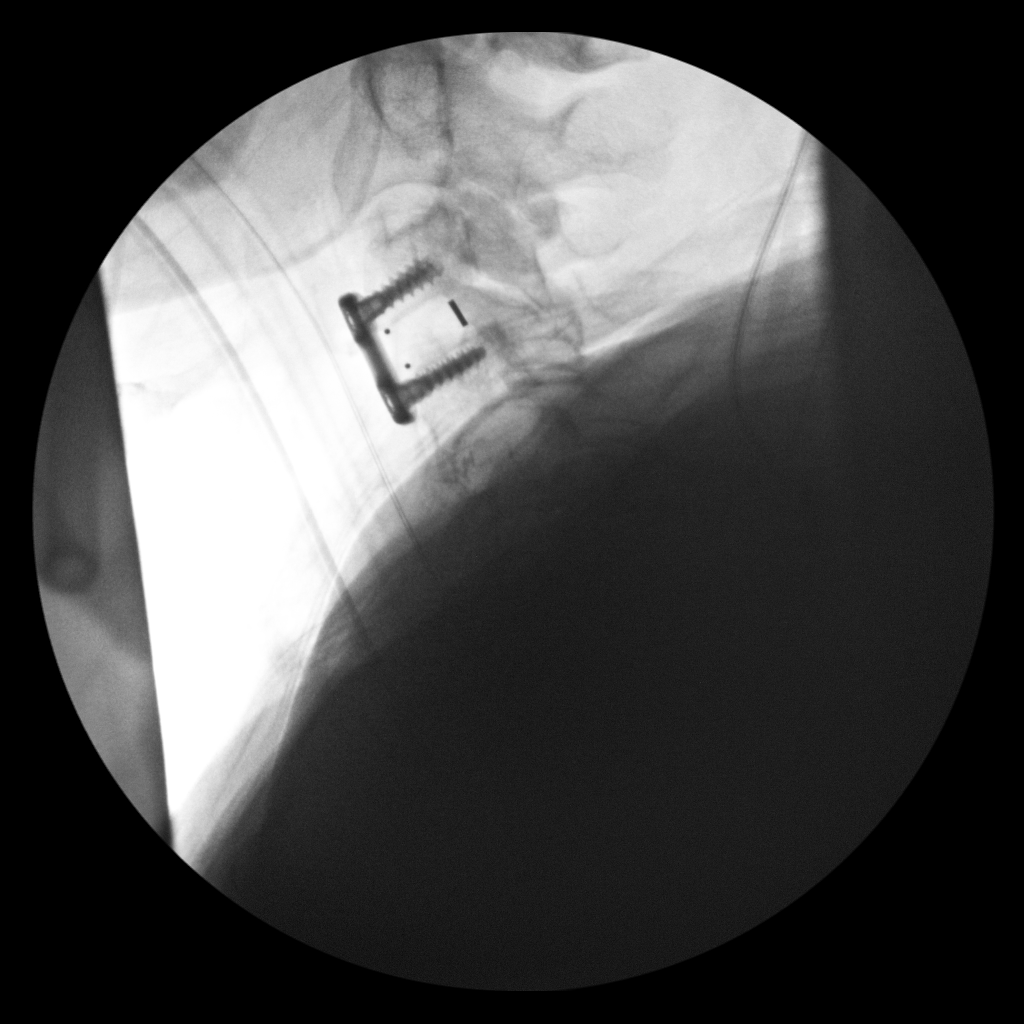

[4 of 4 positions shown; findings below may reference images not displayed]

FINDINGS: Four fluoroscopic spot views of the cervical spine obtained in
lateral projection. Anterior fusion of C3-C4 with interbody spacer.
Total fluoroscopy time 4 seconds. Total dose 0.59 mGy.
IMPRESSION: Intraoperative fluoroscopy during C3-C4 ACDF.

## 2022-02-27 IMAGING — RF DG CERVICAL SPINE 2 OR 3 VIEWS
1 series · 4 of 4 positions shown · non-contrast
Comparison: None.

CLINICAL DATA: ACDF C3-C4.

EXAM:
CERVICAL SPINE - 2-3 VIEW; DG C-ARM 1-60 MIN

[Series 1: run · 4 of 4 slices shown]
[im 1/4]
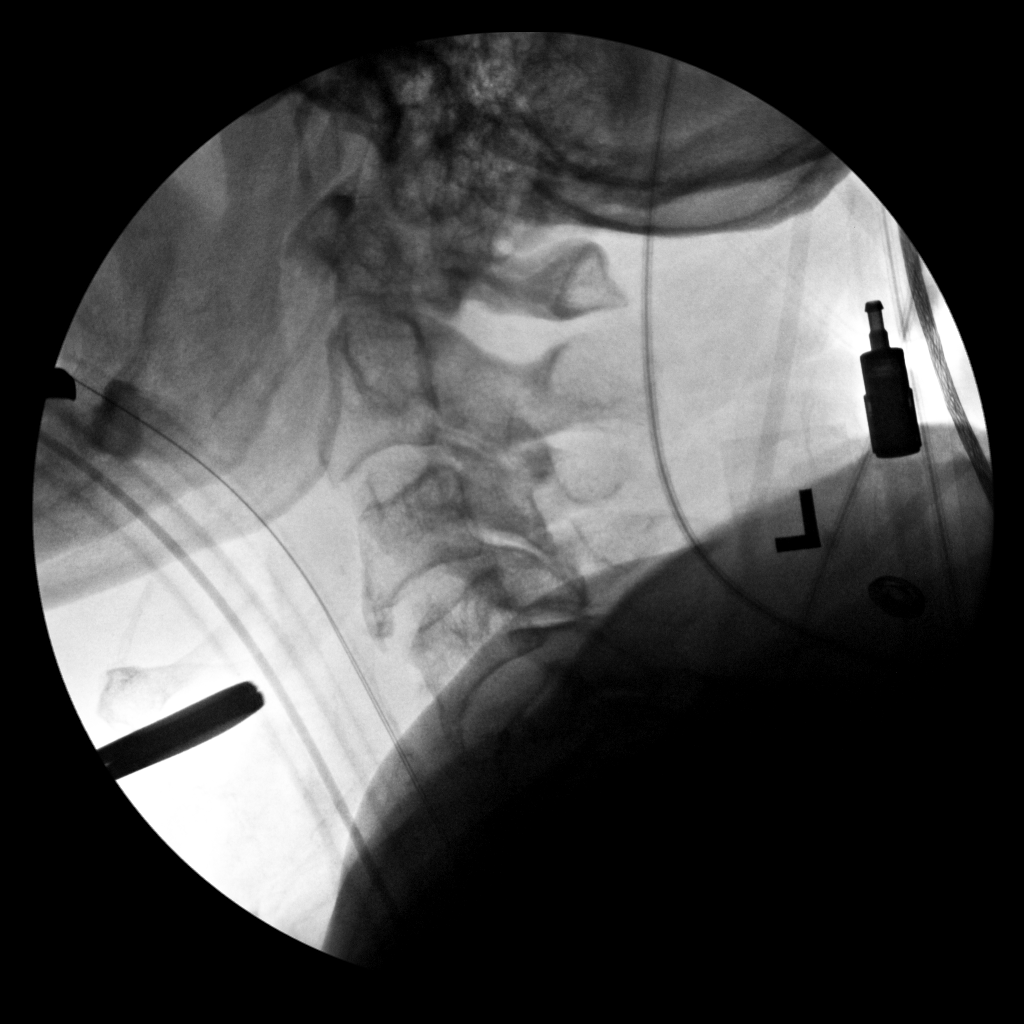
[im 2/4]
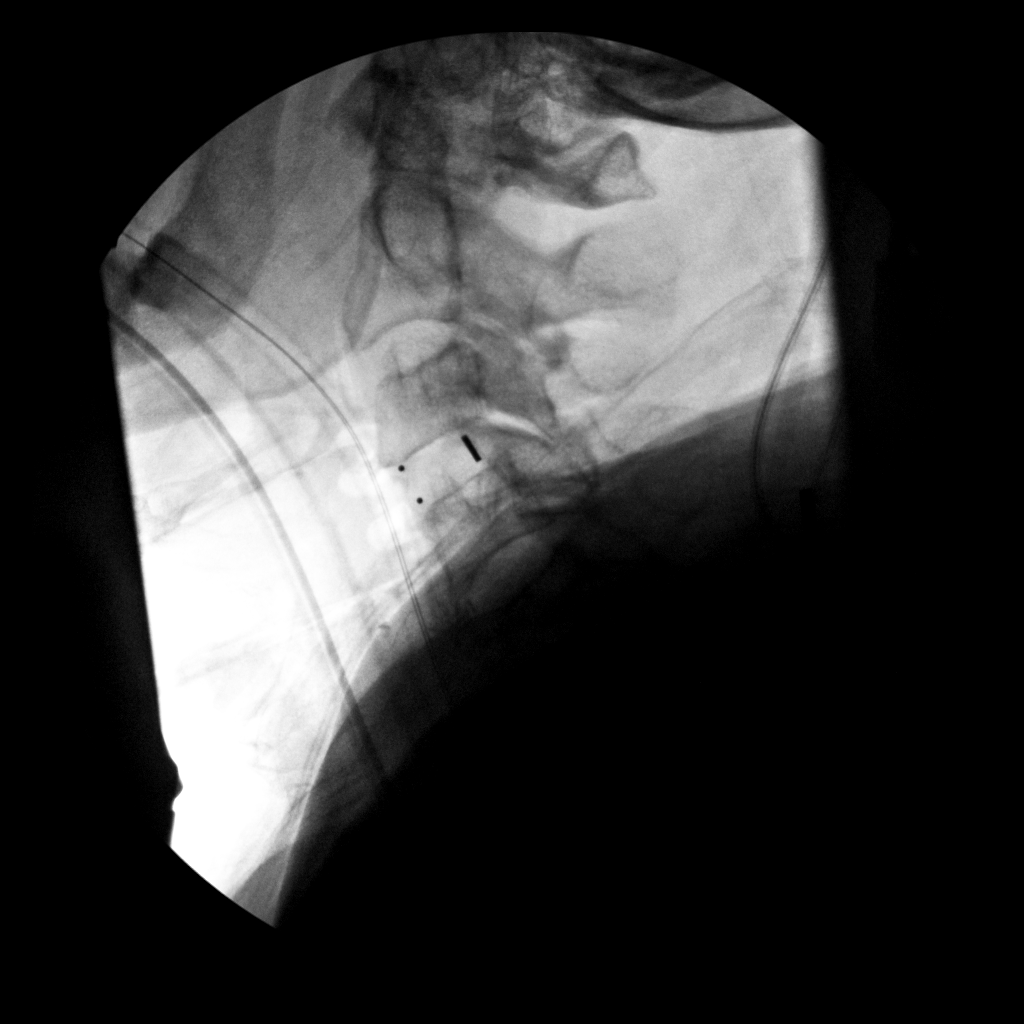
[im 3/4]
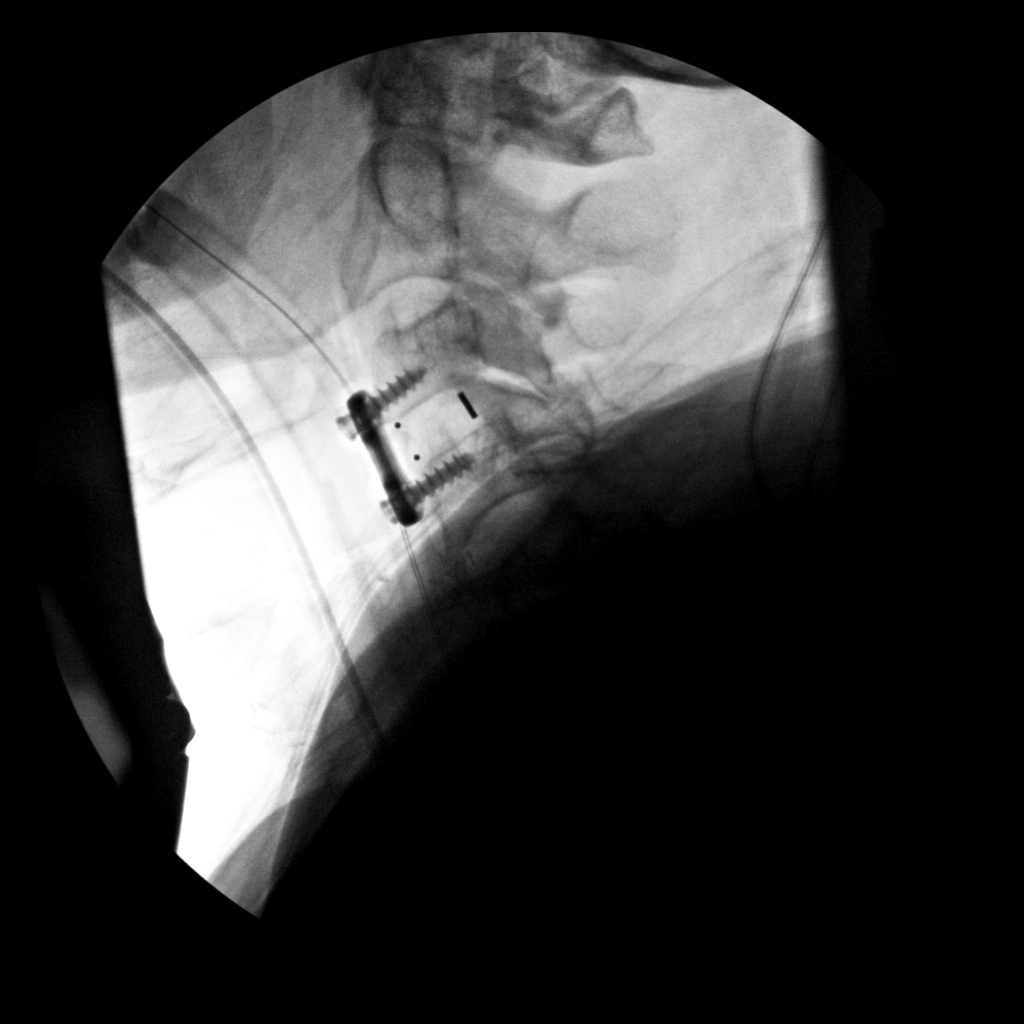
[im 4/4]
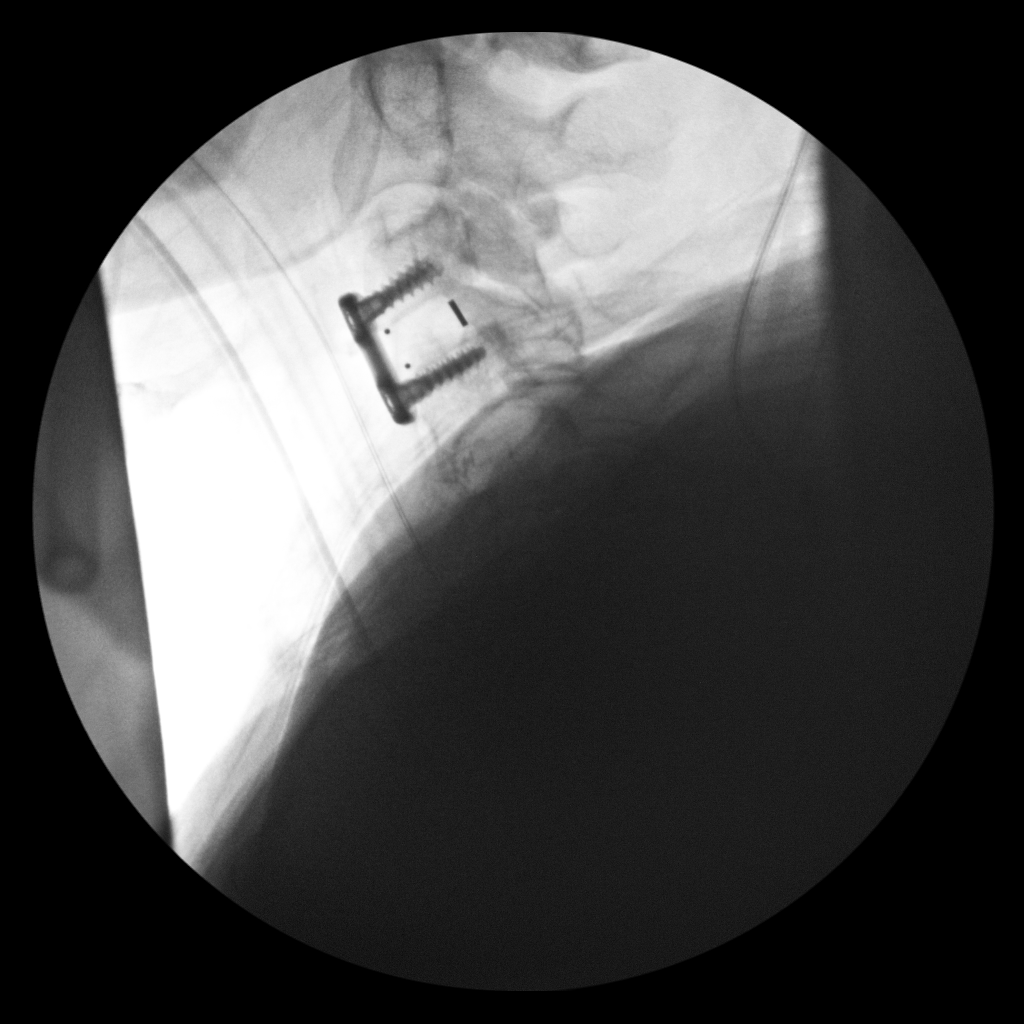

[4 of 4 positions shown; findings below may reference images not displayed]

FINDINGS: Four fluoroscopic spot views of the cervical spine obtained in
lateral projection. Anterior fusion of C3-C4 with interbody spacer.
Total fluoroscopy time 4 seconds. Total dose 0.59 mGy.
IMPRESSION: Intraoperative fluoroscopy during C3-C4 ACDF.

## 2022-03-15 DIAGNOSIS — E785 Hyperlipidemia, unspecified: Secondary | ICD-10-CM | POA: Diagnosis not present

## 2022-03-15 DIAGNOSIS — I10 Essential (primary) hypertension: Secondary | ICD-10-CM | POA: Diagnosis not present

## 2022-04-10 ENCOUNTER — Encounter: Payer: Self-pay | Admitting: Gastroenterology

## 2022-04-10 ENCOUNTER — Ambulatory Visit (INDEPENDENT_AMBULATORY_CARE_PROVIDER_SITE_OTHER): Payer: Medicare Other | Admitting: Gastroenterology

## 2022-04-10 VITALS — BP 136/80 | HR 61 | Ht 69.5 in | Wt 202.4 lb

## 2022-04-10 DIAGNOSIS — K5150 Left sided colitis without complications: Secondary | ICD-10-CM

## 2022-04-10 MED ORDER — SULFASALAZINE 500 MG PO TABS: 1000.0000 mg | ORAL_TABLET | Freq: Two times a day (BID) | ORAL | 4 refills | Status: DC

## 2022-04-10 NOTE — Patient Instructions (Addendum)
If you are age 81 or older, your body mass index should be between 23-30. Your Body mass index is 29.46 kg/m. If this is out of the aforementioned range listed, please consider follow up with your Primary Care Provider. ________________________________________________________  The [facility] providers would like to encourage you to use [facility] to communicate with providers for non-urgent requests or questions.  Due to long hold times on the telephone, sending your provider a message by [facility] may be a faster and more efficient way to get a response.  Please allow 48 business hours for a response.  Please remember that this is for non-urgent requests.  _______________________________________________________  We have sent the following medications to your pharmacy for you to pick up at your convenience:  CONTINUE: Sulfasalazine 579m tablet take 2 tablets twice daily.  Please return stool cards once you have completed them.  You will need a follow up appointment in 1 year.  We will contact you to get this scheduled.  Thank you for entrusting me with your care and [facility]  Dr GLyndel Safe

## 2022-04-10 NOTE — Progress Notes (Signed)
Chief Complaint: FU  Referring Provider: Dr Micheal Likens      ASSESSMENT AND PLAN;   #1.  Left-sided ulcerative colitis Dx 2011.  #2.  H/O Diverticulitis on CT History of diverticulitis s/p LAR 1/98.  H/O recent diverticulitis 2020.  #3. Mild anemia-followed by Dr. Hinton Rao  Plan: - Sulfasalazine 500 mg/tab. 2 tablets p.o. twice daily #360, 4 refills.  Discussed S/Es including photosensitivity. - Folic 36m po qd. - Hemoccult x 3 - He and his wife wants to hold off on colon d/t advanced age. Will do colon only if problems or H+stools - FU in 1 year  HPI:    Devin MARCHETTAis a 81y.o. male  For medication refill/FU  Doing very well.  No nausea, vomiting, heartburn, regurgitation, odynophagia or dysphagia.  No significant diarrhea or constipation.  No melena or hematochezia. No unintentional weight loss. No abdominal pain.  Seen by Dr. MHinton Rao  Has mild anemia Hb 12, likely myelodysplasia.  Also due to history of previous polyps and L sided UC, he was on recall colon 2023. He would like to wait and get it done if he has heme positive stools or any problems.    Previous GI procedures: -Colonoscopy 07/2018, minimal smudged vascular pattern up to 55 cm.  No active colitis.  Pancolonic diverticulosis.  Bx- neg. small tubular adenoma s/p polypectomy.  08/2014: Left-sided ulcerative colitis up to 55 cm, normal remaining colon, normal TI.  Mild pancolonic diverticulosis, status post sigmoid resection with patent anastomosis. - CT AP 05/2019: No acute findings.  Postoperative findings of sigmoid colon resection and anastomosis.  Occasional diverticula.  Also noted is a diverticulum in the descending duodenum.  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 Normal remaining colon. Normal TI. Mild pancolonic diverticulosis. Status post sigmoid resection with patent colo-colic anastomosis.    ESOPHAGOGASTRODUODENOSCOPY     Dr BMelina Copa Years ago per patient    LOW ANTERIOR BOWEL RESECTION     MOUTH SURGERY     ROTATOR CUFF REPAIR     THYROIDECTOMY      Family History  Problem Relation Age of Onset   Colon cancer Neg Hx    Esophageal cancer Neg Hx     Social History   Tobacco Use   Smoking status: Never Smoker   Smokeless tobacco: Never Used   Tobacco comment: said he smoked apack in his life as a  teen  Substance Use Topics   Alcohol use: Not Currently   Drug use: Never    Current Outpatient Medications  Medication Sig Dispense Refill   clonazePAM (KLONOPIN) 0.5 MG tablet Take 0.5 mg by mouth at bedtime as needed. for sleep     levothyroxine (SYNTHROID) 137 MCG tablet Take by mouth daily.     losartan (COZAAR) 50 MG tablet Take 50 mg by mouth daily.  12   sulfaSALAzine (AZULFIDINE) 500 MG tablet Take 2 tablets (1,000 mg total) by mouth 2 (two) times daily. 120 tablet 11   No current facility-administered medications for this visit.     Not on File  Review of Systems:  neg     Physical Exam:    Ht 5' 9.5" (1.765 m)   Wt 193 lb (87.5 kg)   BMI 28.09 kg/m  Filed Weights   06/15/19 1118  Weight: 193 lb (87.5 kg)   Gen: awake, alert, NAD HEENT: anicteric, no pallor CV: RRR, no mrg Pulm: CTA b/l Abd: soft, NT/ND, +BS throughout Ext: no c/c/e Neuro: nonfocal    Carmell Austria, MD 06/15/2019, 11:32 AM  Cc: Dr. Micheal Likens

## 2022-04-25 ENCOUNTER — Other Ambulatory Visit (INDEPENDENT_AMBULATORY_CARE_PROVIDER_SITE_OTHER): Payer: Medicare Other

## 2022-04-25 DIAGNOSIS — K5150 Left sided colitis without complications: Secondary | ICD-10-CM | POA: Diagnosis not present

## 2022-04-25 LAB — HEMOCCULT SLIDES (X 3 CARDS)
Fecal Occult Blood: NEGATIVE
OCCULT 1: NEGATIVE
OCCULT 2: NEGATIVE
OCCULT 3: NEGATIVE
OCCULT 4: NEGATIVE
OCCULT 5: NEGATIVE

## 2022-05-27 ENCOUNTER — Other Ambulatory Visit: Payer: Self-pay | Admitting: Oncology

## 2022-05-27 DIAGNOSIS — C73 Malignant neoplasm of thyroid gland: Secondary | ICD-10-CM

## 2022-05-27 NOTE — Progress Notes (Signed)
Patient Care Team: Angelina Sheriff, MD as PCP - General (Family Medicine) Mordecai Rasmussen, MD as Referring Physician (Surgery) Callie Fielding, NP as Nurse Practitioner (Nurse Practitioner) Derwood Kaplan, MD as Consulting Physician (Oncology)  Clinic Day:  05/28/22  Referring physician: Earlyne Iba, NP  ASSESSMENT & PLAN:   Assessment & Plan:  Thyroid cancer [facility]) Remote history of thyroid cancer diagnosed in 1997, treated with surgery and iodine 131. He remains without evidence of recurrence.  H his dose of levothyroxine was decreased last year from 125 mcg to 112 mcg.  I will notify him when I have the thyroid tests from today.  Once we know the dose, I will send in refills for the year.   Anemia Anemia, worsening, but now better and back to his baseline.  This has slowly worsened over time and his hemoglobin was 12.9 back in November 2020.  Iron studies, B12 and folate, serum protein electrophoresis, and rheumatoid factor from last year were all unremarkable.  However, there was an increase in monocytes which brings up the likely possibility of early myelodysplasia. This is slightly improved today.  We will continue to monitor this.  This could also represent intermittent bleeding from his ulcerative colitis and/or polyps.  The other possibility would be his sulfasalazine.   I will call him on the lab results when they are available and then refill his Synthroid.  I will see him again in 1 year with CBC, comprehensive metabolic profile, TSH and T4.  The patient understands the plans discussed today and is in agreement with them.  He knows to contact our office if he develops concerns prior to his next appointment.       Derwood Kaplan, MD  [facility] [address] [place] [address] Dept: [phone] Dept Fax: [phone]   No orders of the defined types were placed in this encounter.      CHIEF COMPLAINT:  CC: An 81 year old male with remote history of thyroid cancer, and history of chronic anemia  Current Treatment:  Surveillance  INTERVAL HISTORY:  Devin [name] is here today for repeat clinical assessment.  His hemoglobin was mildly worse earlier this year and so was repeated and evaluated with no evidence of vitamin deficiency, multiple myeloma or rheumatoid arthritis.  He is on sulfasalazine for ulcerative colitis and so I suspect it may be related to that.  Repeat hemoglobin had come up to 12 in July.  He had a mild monocytosis last year, but that has resolved.  I have recommended we simply follow it.  He has a remote history of thyroid cancer in 1997, treated with surgery and radioactive iodine.  He is asked me to monitor his thyroid function and prescribe his supplement, so I have been doing this for the last several years.  Last year I had decreased his Synthroid to 112 mcg.  After I see today's labs, I will let him know if he needs any other adjustment and then send in refills for 1 year. He denies fevers or chills. He denies pain. His appetite is good.  His weight is stable.  I have reviewed the past medical history, past surgical history, social history and family history with the patient and they are unchanged from previous note.  ALLERGIES:  has No Known Allergies.  MEDICATIONS:  Current Outpatient Medications  Medication Sig Dispense Refill   levothyroxine (SYNTHROID) 125 MCG tablet Take 1 tablet (  125 mcg total) by mouth daily before breakfast. 90 tablet 3   aspirin EC 81 MG tablet Take 1 tablet (81 mg total) by mouth daily. Swallow whole. 30 tablet 11   donepezil (ARICEPT) 5 MG tablet Take 1 tablet (5 mg total) by mouth at bedtime. 30 tablet 11   losartan (COZAAR) 100 MG tablet Take by mouth.     Multiple Vitamin (MULTIVITAMIN) capsule Take 1 capsule by mouth daily.     sulfaSALAzine (AZULFIDINE) 500 MG tablet Take 2 tablets (1,000 mg total) by mouth 2 (two) times  daily. 360 tablet 4   No current facility-administered medications for this visit.    HISTORY OF PRESENT ILLNESS:   Oncology History   No history exists.      REVIEW OF SYSTEMS:   Constitutional: Denies fevers, chills or abnormal weight loss Eyes: Denies blurriness of vision Ears, nose, mouth, throat, and face: Denies mucositis or sore throat Respiratory: Denies cough, dyspnea or wheezes Cardiovascular: Denies palpitation, chest discomfort or lower extremity swelling Gastrointestinal:  Denies nausea, heartburn or change in bowel habits Skin: Denies abnormal skin rashes Lymphatics: Denies new lymphadenopathy or easy bruising Neurological:Denies numbness, tingling or new weaknesses Behavioral/Psych: Mood is stable, no new changes  All other systems were reviewed with the patient and are negative.   VITALS:  Blood pressure (!) 153/84, pulse 81, temperature 98.3 F (36.8 C), temperature source Oral, resp. rate 18, height 5' 9.5" (1.765 m), weight 202 lb 8 oz (91.9 kg), SpO2 98 %.  Wt Readings from Last 3 Encounters:  05/28/22 202 lb 8 oz (91.9 kg)  04/10/22 202 lb 6 oz (91.8 kg)  01/24/22 203 lb 8 oz (92.3 kg)    Body mass index is 29.48 kg/m.  Performance status (ECOG): 1 - Symptomatic but completely ambulatory  PHYSICAL EXAM:   GENERAL:alert, no distress and comfortable SKIN: skin color, texture, turgor are normal, no rashes or significant lesions EYES: normal, Conjunctiva are pink and non-injected, sclera clear OROPHARYNX:no exudate, no erythema and lips, buccal mucosa, and tongue normal  NECK: supple, thyroid normal size, non-tender, without nodularity LYMPH:  no palpable lymphadenopathy in the cervical, axillary or inguinal LUNGS: clear to auscultation and percussion with normal breathing effort HEART: regular rate & rhythm and no murmurs and no lower extremity edema ABDOMEN:abdomen soft, non-tender and normal bowel sounds Musculoskeletal:no cyanosis of digits and  no clubbing  NEURO: alert & oriented x 3 with fluent speech, no focal motor/sensory deficits  LABORATORY DATA:  I have reviewed the data as listed    Component Value Date/Time   NA 142 05/28/2022 0927   NA 143 01/24/2022 0000   K 3.6 05/28/2022 0927   CL 104 05/28/2022 0927   CO2 27 05/28/2022 0927   GLUCOSE 126 (H) 05/28/2022 0927   BUN 23 05/28/2022 0927   BUN 25 (A) 01/24/2022 0000   CREATININE 1.01 05/28/2022 0927   CALCIUM 8.8 (L) 05/28/2022 0927   PROT 6.8 05/28/2022 0927   ALBUMIN 3.8 05/28/2022 0927   AST 27 05/28/2022 0927   ALT 17 05/28/2022 0927   ALKPHOS 118 05/28/2022 0927   BILITOT 0.5 05/28/2022 0927   GFRNONAA >60 05/28/2022 0927    No results found for: "SPEP", "UPEP"  Lab Results  Component Value Date   WBC 6.8 05/28/2022   NEUTROABS 4.1 05/28/2022   HGB 12.0 (L) 05/28/2022   HCT 37.6 (L) 05/28/2022   MCV 92.8 05/28/2022   PLT 291 05/28/2022      Chemistry  Component Value Date/Time   NA 142 05/28/2022 0927   NA 143 01/24/2022 0000   K 3.6 05/28/2022 0927   CL 104 05/28/2022 0927   CO2 27 05/28/2022 0927   BUN 23 05/28/2022 0927   BUN 25 (A) 01/24/2022 0000   CREATININE 1.01 05/28/2022 0927   GLU 107 01/24/2022 0000      Component Value Date/Time   CALCIUM 8.8 (L) 05/28/2022 0927   ALKPHOS 118 05/28/2022 0927   AST 27 05/28/2022 0927   ALT 17 05/28/2022 0927   BILITOT 0.5 05/28/2022 0927       RADIOGRAPHIC STUDIES: I have personally reviewed the radiological images as listed and agreed with the findings in the report. No results found.

## 2022-05-28 ENCOUNTER — Telehealth: Payer: Self-pay | Admitting: Oncology

## 2022-05-28 ENCOUNTER — Encounter: Payer: Self-pay | Admitting: Oncology

## 2022-05-28 ENCOUNTER — Other Ambulatory Visit: Payer: Self-pay | Admitting: Oncology

## 2022-05-28 ENCOUNTER — Inpatient Hospital Stay: Payer: Medicare Other | Attending: Oncology | Admitting: Oncology

## 2022-05-28 ENCOUNTER — Inpatient Hospital Stay: Payer: Medicare Other

## 2022-05-28 VITALS — BP 153/84 | HR 81 | Temp 98.3°F | Resp 18 | Ht 69.5 in | Wt 202.5 lb

## 2022-05-28 DIAGNOSIS — D649 Anemia, unspecified: Secondary | ICD-10-CM | POA: Insufficient documentation

## 2022-05-28 DIAGNOSIS — C73 Malignant neoplasm of thyroid gland: Secondary | ICD-10-CM

## 2022-05-28 DIAGNOSIS — Z85850 Personal history of malignant neoplasm of thyroid: Secondary | ICD-10-CM | POA: Diagnosis not present

## 2022-05-28 DIAGNOSIS — Z79899 Other long term (current) drug therapy: Secondary | ICD-10-CM | POA: Insufficient documentation

## 2022-05-28 DIAGNOSIS — K5190 Ulcerative colitis, unspecified, without complications: Secondary | ICD-10-CM | POA: Insufficient documentation

## 2022-05-28 DIAGNOSIS — E039 Hypothyroidism, unspecified: Secondary | ICD-10-CM | POA: Diagnosis not present

## 2022-05-28 LAB — CMP (CANCER CENTER ONLY)
ALT: 17 U/L (ref 0–44)
AST: 27 U/L (ref 15–41)
Albumin: 3.8 g/dL (ref 3.5–5.0)
Alkaline Phosphatase: 118 U/L (ref 38–126)
Anion gap: 11 (ref 5–15)
BUN: 23 mg/dL (ref 8–23)
CO2: 27 mmol/L (ref 22–32)
Calcium: 8.8 mg/dL — ABNORMAL LOW (ref 8.9–10.3)
Chloride: 104 mmol/L (ref 98–111)
Creatinine: 1.01 mg/dL (ref 0.61–1.24)
GFR, Estimated: >60 mL/min (ref >60)
Glucose, Bld: 126 mg/dL — ABNORMAL HIGH (ref 70–99)
Potassium: 3.6 mmol/L (ref 3.5–5.1)
Sodium: 142 mmol/L (ref 135–145)
Total Bilirubin: 0.5 mg/dL (ref 0.3–1.2)
Total Protein: 6.8 g/dL (ref 6.5–8.1)

## 2022-05-28 LAB — CBC WITH DIFFERENTIAL (CANCER CENTER ONLY)
Abs Immature Granulocytes: 0.04 10*3/uL (ref 0.00–0.07)
Basophils Absolute: 0.1 10*3/uL (ref 0.0–0.1)
Basophils Relative: 1 %
Eosinophils Absolute: 0.4 10*3/uL (ref 0.0–0.5)
Eosinophils Relative: 6 %
HCT: 37.6 % — ABNORMAL LOW (ref 39.0–52.0)
Hemoglobin: 12 g/dL — ABNORMAL LOW (ref 13.0–17.0)
Immature Granulocytes: 1 %
Lymphocytes Relative: 21 %
Lymphs Abs: 1.4 10*3/uL (ref 0.7–4.0)
MCH: 29.6 pg (ref 26.0–34.0)
MCHC: 31.9 g/dL (ref 30.0–36.0)
MCV: 92.8 fL (ref 80.0–100.0)
Monocytes Absolute: 0.7 10*3/uL (ref 0.1–1.0)
Monocytes Relative: 10 %
Neutro Abs: 4.1 10*3/uL (ref 1.7–7.7)
Neutrophils Relative %: 61 %
Platelet Count: 291 10*3/uL (ref 150–400)
RBC: 4.05 MIL/uL — ABNORMAL LOW (ref 4.22–5.81)
RDW: 14.1 % (ref 11.5–15.5)
WBC Count: 6.8 10*3/uL (ref 4.0–10.5)
nRBC: 0 % (ref 0.0–0.2)

## 2022-05-28 NOTE — Telephone Encounter (Signed)
05/28/22 Next appt scheduled and confirmed with patient

## 2022-06-01 ENCOUNTER — Other Ambulatory Visit: Payer: Self-pay

## 2022-06-01 ENCOUNTER — Telehealth: Payer: Self-pay

## 2022-06-01 DIAGNOSIS — C73 Malignant neoplasm of thyroid gland: Secondary | ICD-10-CM

## 2022-06-01 DIAGNOSIS — K5190 Ulcerative colitis, unspecified, without complications: Secondary | ICD-10-CM | POA: Diagnosis not present

## 2022-06-01 DIAGNOSIS — Z79899 Other long term (current) drug therapy: Secondary | ICD-10-CM | POA: Diagnosis not present

## 2022-06-01 DIAGNOSIS — D649 Anemia, unspecified: Secondary | ICD-10-CM | POA: Diagnosis not present

## 2022-06-01 DIAGNOSIS — Z85850 Personal history of malignant neoplasm of thyroid: Secondary | ICD-10-CM | POA: Diagnosis not present

## 2022-06-01 LAB — TSH: TSH: 7.006 u[IU]/mL — ABNORMAL HIGH (ref 0.350–4.500)

## 2022-06-01 NOTE — Telephone Encounter (Signed)
-----   Message from Vickki Hearing sent at 06/01/2022  8:37 AM EST ----- Regarding: RE: thyroid Good Morning! It looks like we only did a CBC and CMP on 05/28/22  ----- Message ----- From: Derwood Kaplan, MD Sent: 05/31/2022   6:51 PM EST To: Vickki Hearing; [name], LPN Subject: thyroid                                        Wonder why the thyroid tests taking so long? I check every day.  They did get sent?

## 2022-06-01 NOTE — Telephone Encounter (Signed)
Wife called wanting to know about his Synthroid. He took his last one today. He is out and needs a refill. Also noticed that a thyroid test were not done. Einar Pheasant is checking on this. Please advise.

## 2022-06-01 NOTE — Telephone Encounter (Signed)
-----   Message from Derwood Kaplan, MD sent at 05/31/2022  6:50 PM EST ----- Regarding: thyroid Wonder why the thyroid tests taking so long? I check every day.  They did get sent?

## 2022-06-01 NOTE — Telephone Encounter (Signed)
Wife notified of Synthroid dose change.

## 2022-06-02 LAB — T4: T4, Total: 7.5 ug/dL (ref 4.5–12.0)

## 2022-06-04 ENCOUNTER — Other Ambulatory Visit: Payer: Self-pay | Admitting: Oncology

## 2022-06-04 DIAGNOSIS — C73 Malignant neoplasm of thyroid gland: Secondary | ICD-10-CM

## 2022-06-04 MED ORDER — LEVOTHYROXINE SODIUM 125 MCG PO TABS: 125.0000 ug | ORAL_TABLET | Freq: Every day | ORAL | 3 refills | Status: DC

## 2022-06-05 DIAGNOSIS — Z23 Encounter for immunization: Secondary | ICD-10-CM | POA: Diagnosis not present

## 2022-06-15 DIAGNOSIS — J329 Chronic sinusitis, unspecified: Secondary | ICD-10-CM | POA: Diagnosis not present

## 2022-06-15 DIAGNOSIS — R3589 Other polyuria: Secondary | ICD-10-CM | POA: Diagnosis not present

## 2022-06-15 DIAGNOSIS — Z6828 Body mass index (BMI) 28.0-28.9, adult: Secondary | ICD-10-CM | POA: Diagnosis not present

## 2022-06-15 DIAGNOSIS — J40 Bronchitis, not specified as acute or chronic: Secondary | ICD-10-CM | POA: Diagnosis not present

## 2022-06-22 DIAGNOSIS — R509 Fever, unspecified: Secondary | ICD-10-CM | POA: Diagnosis not present

## 2022-06-22 DIAGNOSIS — R0981 Nasal congestion: Secondary | ICD-10-CM | POA: Diagnosis not present

## 2022-06-22 DIAGNOSIS — J111 Influenza due to unidentified influenza virus with other respiratory manifestations: Secondary | ICD-10-CM | POA: Diagnosis not present

## 2022-06-22 DIAGNOSIS — Z20822 Contact with and (suspected) exposure to covid-19: Secondary | ICD-10-CM | POA: Diagnosis not present

## 2022-07-17 DIAGNOSIS — G5603 Carpal tunnel syndrome, bilateral upper limbs: Secondary | ICD-10-CM | POA: Diagnosis not present

## 2022-08-07 ENCOUNTER — Other Ambulatory Visit: Payer: Self-pay | Admitting: *Deleted

## 2022-08-07 DIAGNOSIS — I723 Aneurysm of iliac artery: Secondary | ICD-10-CM

## 2022-08-08 DIAGNOSIS — G5613 Other lesions of median nerve, bilateral upper limbs: Secondary | ICD-10-CM | POA: Diagnosis not present

## 2022-08-13 DIAGNOSIS — M79642 Pain in left hand: Secondary | ICD-10-CM | POA: Diagnosis not present

## 2022-08-13 DIAGNOSIS — M79641 Pain in right hand: Secondary | ICD-10-CM | POA: Diagnosis not present

## 2022-08-15 ENCOUNTER — Ambulatory Visit (INDEPENDENT_AMBULATORY_CARE_PROVIDER_SITE_OTHER): Payer: Medicare Other | Admitting: Physician Assistant

## 2022-08-15 ENCOUNTER — Ambulatory Visit (HOSPITAL_COMMUNITY)
Admission: RE | Admit: 2022-08-15 | Discharge: 2022-08-15 | Disposition: A | Discharge status: Home / Self Care | Payer: Medicare Other | Source: Ambulatory Visit | Attending: Vascular Surgery | Admitting: Vascular Surgery

## 2022-08-15 VITALS — BP 176/73 | HR 68 | Temp 98.0°F | Ht 69.0 in | Wt 200.0 lb

## 2022-08-15 DIAGNOSIS — I723 Aneurysm of iliac artery: Secondary | ICD-10-CM

## 2022-08-15 NOTE — Progress Notes (Signed)
Office Note   History of Present Illness   Devin [name] is a 82 y.o. (07/26/1940) male who presents for surveillance of known left common iliac artery aneurysm. Previous measurement by CT scan in 2022 measured the aneurysm at 3.2 cm. The patient has previously been told that he may potentially require endovascular repair at 3.5 cm or open repair at 4.0 cm.   At follow-up today he denies any abdominal or back pain.  He also denies any rest pain, claudication, wounds of lower extremities.  He has had some elevated blood pressures recently and will be following up with his new PCP in March for medication management.  Current Outpatient Medications  Medication Sig Dispense Refill   levothyroxine (SYNTHROID) 125 MCG tablet Take 1 tablet (125 mcg total) by mouth daily before breakfast. 90 tablet 3   aspirin EC 81 MG tablet Take 1 tablet (81 mg total) by mouth daily. Swallow whole. 30 tablet 11   donepezil (ARICEPT) 5 MG tablet Take 1 tablet (5 mg total) by mouth at bedtime. 30 tablet 11   losartan (COZAAR) 100 MG tablet Take by mouth.     Multiple Vitamin (MULTIVITAMIN) capsule Take 1 capsule by mouth daily.     sulfaSALAzine (AZULFIDINE) 500 MG tablet Take 2 tablets (1,000 mg total) by mouth 2 (two) times daily. 360 tablet 4   No current facility-administered medications for this visit.    REVIEW OF SYSTEMS (negative unless checked):   Cardiac:  '[]'$  Chest pain or chest pressure? '[]'$  Shortness of breath upon activity? '[]'$  Shortness of breath when lying flat? '[]'$  Irregular heart rhythm?  Vascular:  '[]'$  Pain in calf, thigh, or hip brought on by walking? '[]'$  Pain in feet at night that wakes you up from your sleep? '[]'$  Blood clot in your veins? '[]'$  Leg swelling?  Pulmonary:  '[]'$  Oxygen at home? '[]'$  Productive cough? '[]'$  Wheezing?  Neurologic:  '[]'$  Sudden weakness in arms or legs? '[]'$  Sudden numbness in arms or legs? '[]'$  Sudden onset of difficult speaking or slurred speech? '[]'$   Temporary loss of vision in one eye? '[]'$  Problems with dizziness?  Gastrointestinal:  '[]'$  Blood in stool? '[]'$  Vomited blood?  Genitourinary:  '[]'$  Burning when urinating? '[]'$  Blood in urine?  Psychiatric:  '[]'$  Major depression  Hematologic:  '[]'$  Bleeding problems? '[]'$  Problems with blood clotting?  Dermatologic:  '[]'$  Rashes or ulcers?  Constitutional:  '[]'$  Fever or chills?  Ear/Nose/Throat:  '[]'$  Change in hearing? '[]'$  Nose bleeds? '[]'$  Sore throat?  Musculoskeletal:  '[]'$  Back pain? '[]'$  Joint pain? '[]'$  Muscle pain?   Physical Examination   Vitals:   08/15/22 0919  BP: (!) 176/73  Pulse: 68  Temp: 98 F (36.7 C)  TempSrc: Temporal  SpO2: 99%  Weight: 200 lb (90.7 kg)  Height: '5\' 9"'$  (1.753 m)   Body mass index is 29.53 kg/m.  General:  WDWN in NAD; vital signs documented above Gait: Not observed HENT: WNL, normocephalic Pulmonary: normal non-labored breathing Cardiac: regular rate and rhythm Abdomen: soft, NT, no masses Skin: without rashes Vascular Exam/Pulses: BLE warm and well perfused Extremities: without ischemic changes, without gangrene , without cellulitis; without open wounds;  Musculoskeletal: no muscle wasting or atrophy  Neurologic: A&O X 3;  No focal weakness or paresthesias are detected Psychiatric:  The pt has Normal affect.  Non-Invasive Vascular imaging   Aortoiliac Duplex (08/15/2022) +-------------+-------+----------+----------+----------------+--------+----  ----+  Location    AP (cm)Trans (cm)PSV (cm/s)Waveform  ThrombusComments  +-------------+-------+----------+----------+----------------+--------+----  ----+  Proximal    1.88   1.97      78        triphasic                          +-------------+-------+----------+----------+----------------+--------+----  ----+  Mid         1.86             92        biphasic                            +-------------+-------+----------+----------+----------------+--------+----  ----+  Distal      1.74             72        biphasic                           +-------------+-------+----------+----------+----------------+--------+----  ----+  RT CIA Prox  1.1    1.2       75        triphasic                          +-------------+-------+----------+----------+----------------+--------+----  ----+  LT CIA Prox  1.3    1.1       100       triphasic                          +-------------+-------+----------+----------+----------------+--------+----  ----+  LT CIA Mid   2.7    2.9       253       triphasic                fusiform  +-------------+-------+----------+----------+----------------+--------+----  ----+  LT CIA Distal0.8    1.0       104       Blunted but                                                                technically                                                                triphasic                          +-------------+-------+----------+----------+----------------+--------+----  ----+     Medical Decision Making   Devin [name] is a 82 y.o. male who presents for left common iliac artery aneurysm surveillance  Based on the patient's vascular studies, the patient's left common iliac artery aneurysm is stable in at its largest measures 2.9 cm. The patient has no back pain or abdominal pain.  He also has no rest pain or claudication in the lower extremities. The patient intends to follow-up with his PCP soon for medical management of his hypertension.  I have encouraged him to increase his exercise  and modify his diet to help with blood pressure control in the meantime. He can follow-up with our office in 1 year with repeat aortoiliac duplex study   Vicente Serene PA-C [facility]: [facility] MD: Donzetta Matters

## 2022-08-27 ENCOUNTER — Telehealth: Payer: Self-pay | Admitting: Neurology

## 2022-08-27 NOTE — Telephone Encounter (Signed)
Pt wife is calling. Stated he calling people 8 times a day and he is telling her she is telling him things she didn't say. Stated pt is always wanting to be with her. Pt said she needs to talk with a nurse to help her figure out how to handle pt. She is asking if you can call her between 9am- 12pm to discuss these issues.

## 2022-08-28 DIAGNOSIS — G4700 Insomnia, unspecified: Secondary | ICD-10-CM | POA: Diagnosis not present

## 2022-08-28 DIAGNOSIS — J329 Chronic sinusitis, unspecified: Secondary | ICD-10-CM | POA: Diagnosis not present

## 2022-08-28 DIAGNOSIS — I10 Essential (primary) hypertension: Secondary | ICD-10-CM | POA: Diagnosis not present

## 2022-08-28 DIAGNOSIS — R351 Nocturia: Secondary | ICD-10-CM | POA: Diagnosis not present

## 2022-08-28 NOTE — Telephone Encounter (Signed)
Pt's wife was called back, and message from RN was relayed.  Wife will write a note re: concerns she has that she does not want to mention in front of pt.  She'd like to hand the note to RN to hand to Dr April Manson so he can be aware of concerns before entering the room to see pt.

## 2022-08-31 DIAGNOSIS — I6782 Cerebral ischemia: Secondary | ICD-10-CM | POA: Diagnosis not present

## 2022-08-31 DIAGNOSIS — R55 Syncope and collapse: Secondary | ICD-10-CM | POA: Diagnosis not present

## 2022-08-31 DIAGNOSIS — K5790 Diverticulosis of intestine, part unspecified, without perforation or abscess without bleeding: Secondary | ICD-10-CM | POA: Diagnosis not present

## 2022-08-31 DIAGNOSIS — K51911 Ulcerative colitis, unspecified with rectal bleeding: Secondary | ICD-10-CM | POA: Diagnosis not present

## 2022-08-31 DIAGNOSIS — S0003XA Contusion of scalp, initial encounter: Secondary | ICD-10-CM | POA: Diagnosis not present

## 2022-08-31 DIAGNOSIS — R053 Chronic cough: Secondary | ICD-10-CM | POA: Diagnosis not present

## 2022-08-31 DIAGNOSIS — N281 Cyst of kidney, acquired: Secondary | ICD-10-CM | POA: Diagnosis not present

## 2022-08-31 DIAGNOSIS — R531 Weakness: Secondary | ICD-10-CM | POA: Diagnosis not present

## 2022-08-31 DIAGNOSIS — Z043 Encounter for examination and observation following other accident: Secondary | ICD-10-CM | POA: Diagnosis not present

## 2022-08-31 DIAGNOSIS — E8809 Other disorders of plasma-protein metabolism, not elsewhere classified: Secondary | ICD-10-CM | POA: Diagnosis not present

## 2022-08-31 DIAGNOSIS — K922 Gastrointestinal hemorrhage, unspecified: Secondary | ICD-10-CM | POA: Diagnosis not present

## 2022-08-31 DIAGNOSIS — Z8679 Personal history of other diseases of the circulatory system: Secondary | ICD-10-CM | POA: Diagnosis not present

## 2022-08-31 DIAGNOSIS — K264 Chronic or unspecified duodenal ulcer with hemorrhage: Secondary | ICD-10-CM | POA: Diagnosis not present

## 2022-08-31 DIAGNOSIS — D62 Acute posthemorrhagic anemia: Secondary | ICD-10-CM | POA: Diagnosis not present

## 2022-08-31 DIAGNOSIS — K5190 Ulcerative colitis, unspecified, without complications: Secondary | ICD-10-CM | POA: Diagnosis not present

## 2022-08-31 DIAGNOSIS — K921 Melena: Secondary | ICD-10-CM | POA: Diagnosis not present

## 2022-08-31 DIAGNOSIS — K269 Duodenal ulcer, unspecified as acute or chronic, without hemorrhage or perforation: Secondary | ICD-10-CM | POA: Diagnosis not present

## 2022-08-31 DIAGNOSIS — R9431 Abnormal electrocardiogram [ECG] [EKG]: Secondary | ICD-10-CM | POA: Diagnosis not present

## 2022-08-31 DIAGNOSIS — W19XXXA Unspecified fall, initial encounter: Secondary | ICD-10-CM | POA: Diagnosis not present

## 2022-08-31 DIAGNOSIS — Z85850 Personal history of malignant neoplasm of thyroid: Secondary | ICD-10-CM | POA: Diagnosis not present

## 2022-08-31 DIAGNOSIS — M47812 Spondylosis without myelopathy or radiculopathy, cervical region: Secondary | ICD-10-CM | POA: Diagnosis not present

## 2022-08-31 DIAGNOSIS — R519 Headache, unspecified: Secondary | ICD-10-CM | POA: Diagnosis not present

## 2022-08-31 DIAGNOSIS — E890 Postprocedural hypothyroidism: Secondary | ICD-10-CM | POA: Diagnosis not present

## 2022-08-31 DIAGNOSIS — K222 Esophageal obstruction: Secondary | ICD-10-CM | POA: Diagnosis not present

## 2022-08-31 DIAGNOSIS — M5020 Other cervical disc displacement, unspecified cervical region: Secondary | ICD-10-CM | POA: Diagnosis not present

## 2022-08-31 DIAGNOSIS — J069 Acute upper respiratory infection, unspecified: Secondary | ICD-10-CM | POA: Diagnosis not present

## 2022-08-31 DIAGNOSIS — S80212A Abrasion, left knee, initial encounter: Secondary | ICD-10-CM | POA: Diagnosis not present

## 2022-08-31 DIAGNOSIS — F0390 Unspecified dementia without behavioral disturbance: Secondary | ICD-10-CM | POA: Diagnosis not present

## 2022-08-31 DIAGNOSIS — K219 Gastro-esophageal reflux disease without esophagitis: Secondary | ICD-10-CM | POA: Diagnosis not present

## 2022-08-31 DIAGNOSIS — Z20822 Contact with and (suspected) exposure to covid-19: Secondary | ICD-10-CM | POA: Diagnosis not present

## 2022-08-31 DIAGNOSIS — I10 Essential (primary) hypertension: Secondary | ICD-10-CM | POA: Diagnosis not present

## 2022-08-31 DIAGNOSIS — Z9049 Acquired absence of other specified parts of digestive tract: Secondary | ICD-10-CM | POA: Diagnosis not present

## 2022-08-31 DIAGNOSIS — S80211A Abrasion, right knee, initial encounter: Secondary | ICD-10-CM | POA: Diagnosis not present

## 2022-09-07 DIAGNOSIS — K922 Gastrointestinal hemorrhage, unspecified: Secondary | ICD-10-CM | POA: Diagnosis not present

## 2022-09-07 DIAGNOSIS — K5190 Ulcerative colitis, unspecified, without complications: Secondary | ICD-10-CM | POA: Diagnosis not present

## 2022-09-07 DIAGNOSIS — S41119A Laceration without foreign body of unspecified upper arm, initial encounter: Secondary | ICD-10-CM | POA: Diagnosis not present

## 2022-09-07 DIAGNOSIS — Z6829 Body mass index (BMI) 29.0-29.9, adult: Secondary | ICD-10-CM | POA: Diagnosis not present

## 2022-09-11 ENCOUNTER — Encounter: Payer: Self-pay | Admitting: Gastroenterology

## 2022-09-11 ENCOUNTER — Ambulatory Visit (INDEPENDENT_AMBULATORY_CARE_PROVIDER_SITE_OTHER): Payer: Medicare Other | Admitting: Gastroenterology

## 2022-09-11 ENCOUNTER — Other Ambulatory Visit (INDEPENDENT_AMBULATORY_CARE_PROVIDER_SITE_OTHER): Payer: Medicare Other

## 2022-09-11 VITALS — BP 124/60 | HR 88 | Ht 69.0 in | Wt 206.4 lb

## 2022-09-11 DIAGNOSIS — D649 Anemia, unspecified: Secondary | ICD-10-CM

## 2022-09-11 DIAGNOSIS — K922 Gastrointestinal hemorrhage, unspecified: Secondary | ICD-10-CM

## 2022-09-11 DIAGNOSIS — K5150 Left sided colitis without complications: Secondary | ICD-10-CM

## 2022-09-11 DIAGNOSIS — Z8719 Personal history of other diseases of the digestive system: Secondary | ICD-10-CM

## 2022-09-11 LAB — CBC WITH DIFFERENTIAL/PLATELET
Basophils Absolute: 0.1 10*3/uL (ref 0.0–0.1)
Basophils Relative: 0.6 % (ref 0.0–3.0)
Eosinophils Absolute: 0.4 10*3/uL (ref 0.0–0.7)
Eosinophils Relative: 5 % (ref 0.0–5.0)
HCT: 24.2 % — ABNORMAL LOW (ref 39.0–52.0)
Hemoglobin: 8.2 g/dL — ABNORMAL LOW (ref 13.0–17.0)
Lymphocytes Relative: 16.9 % (ref 12.0–46.0)
Lymphs Abs: 1.4 10*3/uL (ref 0.7–4.0)
MCHC: 33.7 g/dL (ref 30.0–36.0)
MCV: 87.2 fl (ref 78.0–100.0)
Monocytes Absolute: 1 10*3/uL (ref 0.1–1.0)
Monocytes Relative: 12.9 % — ABNORMAL HIGH (ref 3.0–12.0)
Neutro Abs: 5.2 10*3/uL (ref 1.4–7.7)
Neutrophils Relative %: 64.6 % (ref 43.0–77.0)
Platelets: 388 10*3/uL (ref 150.0–400.0)
RBC: 2.78 Mil/uL — ABNORMAL LOW (ref 4.22–5.81)
RDW: 14.8 % (ref 11.5–15.5)
WBC: 8.1 10*3/uL (ref 4.0–10.5)

## 2022-09-11 MED ORDER — SULFASALAZINE 500 MG PO TABS: 1000.0000 mg | ORAL_TABLET | Freq: Two times a day (BID) | ORAL | 5 refills | Status: DC

## 2022-09-11 MED ORDER — PANTOPRAZOLE SODIUM 40 MG PO TBEC: 40.0000 mg | DELAYED_RELEASE_TABLET | Freq: Two times a day (BID) | ORAL | 3 refills | Status: DC

## 2022-09-11 MED ORDER — FOLIC ACID 1 MG PO TABS: 1.0000 mg | ORAL_TABLET | Freq: Every day | ORAL | 3 refills | Status: DC

## 2022-09-11 NOTE — Progress Notes (Signed)
Chief Complaint: FU  Referring Provider: Dr Micheal Likens      ASSESSMENT AND PLAN;   #1.  UGI bleed d/t DU. EGD 09/02/2022 showed DU, neg HP. Due to nonsteroidals.  #2. Left-sided ulcerative colitis Dx 2011.  #2.  H/O Diverticulitis on CT History of diverticulitis s/p LAR 1/98.  H/O recent diverticulitis 2020.  #3. Mild anemia-followed by Dr. Hinton Rao  Plan: -Protonix '40mg'$  po BID x 8 weeks, then QD  -CBC today -Start iron tab 1/day -Stop meloxicam/Aleve -Continue sulfasalazine 500 mg/tab. 2 tablets p.o. twice daily #360, 4 refills.  Discussed S/Es including photosensitivity. -Folic '1mg'$  po qd. -FU in June 2024.  HPI:    Devin [name] is a 82 y.o. male  For follow-up  Had melena February 2024 Admitted at [facility] Hb 8.6 to 7.7 (baseline 11-12) S/P EGD 09/02/2022- DU.  Neg HP Meloxicam/Aleve stopped Started on Protonix 40 twice daily for 8 weeks then once a day.  For follow-up Doing very well.  No nausea, vomiting, heartburn, regurgitation, odynophagia or dysphagia.  No significant diarrhea or constipation.  No further melena or hematochezia. No unintentional weight loss. No abdominal pain.  Seen by Dr. Hinton Rao previously.  Has mild anemia Hb 12, likely myelodysplasia.  Also due to history of previous polyps and L sided UC, he was on recall colon 2023. He would like to wait and get it done if he has heme positive stools or any problems.    Previous GI procedures:  EGD 09/02/2022 at [facility] Impression: - Esophageal stenosis. - Normal stomach. Biopsied. - Non-bleeding duodenal ulcer with no stigmata of  bleeding. This is the suspected source of his  presenting symptoms. -Bx- neg for HP.   -Colonoscopy 07/2018, minimal smudged vascular pattern up to 55 cm.  No active colitis.  Pancolonic diverticulosis.  Bx- neg. small tubular adenoma s/p polypectomy.  08/2014: Left-sided ulcerative colitis up to 55 cm, normal remaining colon, normal TI.  Mild pancolonic diverticulosis,  status post sigmoid resection with patent anastomosis. - CT AP 05/2019: No acute findings.  Postoperative findings of sigmoid colon resection and anastomosis.  Occasional diverticula.  Also noted is a diverticulum in the descending duodenum.  Additional social history: Married to Devin [name], self-employed, owns [employer] Daughter Devin [name] and son Devin [name] Past Medical History:  Diagnosis Date   GERD (gastroesophageal reflux disease)    History of diverticulitis    Hypothyroidism    Left sided ulcerative colitis with rectal bleeding [facility])     Past Surgical History:  Procedure Laterality Date   COLONOSCOPY  09/06/2014   Left sided ulcerative colitis up to 55cm. Normal remaining colon. Normal TI. Mild pancolonic diverticulosis. Status post sigmoid resection with patent colo-colic anastomosis.    ESOPHAGOGASTRODUODENOSCOPY     Dr Melina Copa. Years ago per patient    LOW ANTERIOR BOWEL RESECTION     MOUTH SURGERY     ROTATOR CUFF REPAIR     THYROIDECTOMY      Family History  Problem Relation Age of Onset   Colon cancer Neg Hx    Esophageal cancer Neg Hx     Social History   Tobacco Use   Smoking status: Never Smoker   Smokeless tobacco: Never Used   Tobacco comment: said he smoked apack in his life as a  teen  Substance Use Topics   Alcohol use: Not Currently   Drug use: Never    Current Outpatient Medications  Medication Sig Dispense Refill   clonazePAM (KLONOPIN) 0.5 MG tablet  Take 0.5 mg by mouth at bedtime as needed. for sleep     levothyroxine (SYNTHROID) 137 MCG tablet Take by mouth daily.     losartan (COZAAR) 50 MG tablet Take 50 mg by mouth daily.  12   sulfaSALAzine (AZULFIDINE) 500 MG tablet Take 2 tablets (1,000 mg total) by mouth 2 (two) times daily. 120 tablet 11   No current facility-administered medications for this visit.     Not on File  Review of Systems:  neg     Physical Exam:    Ht 5' 9.5" (1.765 m)   Wt 193 lb (87.5 kg)   BMI 28.09  kg/m  Filed Weights   06/15/19 1118  Weight: 193 lb (87.5 kg)   Gen: awake, alert, NAD HEENT: anicteric, no pallor CV: RRR, no mrg Pulm: CTA b/l Abd: soft, NT/ND, +BS throughout Ext: no c/c/e Neuro: nonfocal    Devin Austria, MD 06/15/2019, 11:32 AM  Cc: Dr. Micheal Likens

## 2022-09-11 NOTE — Patient Instructions (Addendum)
_______________________________________________________  If your blood pressure at your visit was 140/90 or greater, please contact your primary care physician to follow up on this.  _______________________________________________________  If you are age 82 or older, your body mass index should be between 23-30. Your Body mass index is 30.48 kg/m. If this is out of the aforementioned range listed, please consider follow up with your Primary Care Provider.  If you are age 68 or younger, your body mass index should be between 19-25. Your Body mass index is 30.48 kg/m. If this is out of the aformentioned range listed, please consider follow up with your Primary Care Provider.   ________________________________________________________  The [facility] providers would like to encourage you to use [facility] to communicate with providers for non-urgent requests or questions.  Due to long hold times on the telephone, sending your provider a message by [facility] may be a faster and more efficient way to get a response.  Please allow 48 business hours for a response.  Please remember that this is for non-urgent requests.  _______________________________________________________  Your provider has requested that you go to the basement level for lab work before leaving today. Press "B" on the elevator. The lab is located at the first door on the left as you exit the elevator.  We have sent the following medications to your pharmacy for you to pick up at your convenience: Folic acid 1 tablet daily Sulfasalazine 2 tablets 2 times a day Protonix 2 times a day for 8 weeks and then daily   Please purchase the following medications over the counter and take as directed: Iron tablet daily  Please follow up in 4 months. Give [place] a call at [phone] to schedule an appointment.  Thank you,  Dr. Jackquline Denmark  Thank you,  Dr. Jackquline Denmark

## 2022-09-17 DIAGNOSIS — M1990 Unspecified osteoarthritis, unspecified site: Secondary | ICD-10-CM | POA: Diagnosis not present

## 2022-09-17 DIAGNOSIS — Z1331 Encounter for screening for depression: Secondary | ICD-10-CM | POA: Diagnosis not present

## 2022-09-17 DIAGNOSIS — Z79899 Other long term (current) drug therapy: Secondary | ICD-10-CM | POA: Diagnosis not present

## 2022-09-17 DIAGNOSIS — D649 Anemia, unspecified: Secondary | ICD-10-CM | POA: Diagnosis not present

## 2022-09-17 DIAGNOSIS — K529 Noninfective gastroenteritis and colitis, unspecified: Secondary | ICD-10-CM | POA: Diagnosis not present

## 2022-09-17 DIAGNOSIS — Z0000 Encounter for general adult medical examination without abnormal findings: Secondary | ICD-10-CM | POA: Diagnosis not present

## 2022-09-17 DIAGNOSIS — E039 Hypothyroidism, unspecified: Secondary | ICD-10-CM | POA: Diagnosis not present

## 2022-09-17 DIAGNOSIS — H40013 Open angle with borderline findings, low risk, bilateral: Secondary | ICD-10-CM | POA: Diagnosis not present

## 2022-09-17 DIAGNOSIS — Z6829 Body mass index (BMI) 29.0-29.9, adult: Secondary | ICD-10-CM | POA: Diagnosis not present

## 2022-09-17 LAB — LAB REPORT - SCANNED: EGFR: 60

## 2022-09-18 ENCOUNTER — Other Ambulatory Visit: Payer: Self-pay

## 2022-09-18 DIAGNOSIS — K922 Gastrointestinal hemorrhage, unspecified: Secondary | ICD-10-CM

## 2022-09-18 DIAGNOSIS — D649 Anemia, unspecified: Secondary | ICD-10-CM

## 2022-09-18 DIAGNOSIS — K51919 Ulcerative colitis, unspecified with unspecified complications: Secondary | ICD-10-CM

## 2022-09-18 NOTE — Progress Notes (Signed)
CBC with Diff faxed to Dr Lin Landsman office

## 2022-09-25 ENCOUNTER — Encounter: Payer: Self-pay | Admitting: Neurology

## 2022-09-25 ENCOUNTER — Ambulatory Visit (INDEPENDENT_AMBULATORY_CARE_PROVIDER_SITE_OTHER): Payer: Medicare Other | Admitting: Neurology

## 2022-09-25 VITALS — BP 166/85 | HR 68 | Ht 69.0 in | Wt 204.5 lb

## 2022-09-25 DIAGNOSIS — G301 Alzheimer's disease with late onset: Secondary | ICD-10-CM | POA: Diagnosis not present

## 2022-09-25 DIAGNOSIS — I10 Essential (primary) hypertension: Secondary | ICD-10-CM

## 2022-09-25 DIAGNOSIS — F02A0 Dementia in other diseases classified elsewhere, mild, without behavioral disturbance, psychotic disturbance, mood disturbance, and anxiety: Secondary | ICD-10-CM

## 2022-09-25 MED ORDER — MEMANTINE HCL 10 MG PO TABS: 10.0000 mg | ORAL_TABLET | Freq: Two times a day (BID) | ORAL | 3 refills | Status: AC

## 2022-09-25 MED ORDER — DONEPEZIL HCL 10 MG PO TABS: 10.0000 mg | ORAL_TABLET | Freq: Every day | ORAL | 3 refills | Status: DC

## 2022-09-25 NOTE — Progress Notes (Signed)
[facility]  PATIENT: Devin [name] DOB: 09-05-1940  REQUESTING CLINICIAN: Earlyne Iba, NP HISTORY FROM: Patient and spouse  REASON FOR VISIT: Memory problems    HISTORICAL  CHIEF COMPLAINT:  Chief Complaint  Patient presents with   Follow-up    Rm 13. Accompanied by wife. States memory is stable. He obtained an rx of memantine from Dr. Lin Landsman but has not taken it yet.   INTERVAL HISTORY 09/25/2022:  Marcello Moores presents today for follow-up, he is accompanied by wife.  He states since last visit he has been stable.  He did have an episode of gastric bleeding ulcer which landed him in the hospital.  But he has been improving.  He is compliant with his medication Aricept.  His PCP started him on memantine but he has not started the medicine.  Wife reported that memory is getting slightly worse, his short-term memory is getting worse.  At times he also can get irritable.  He does not exercise as needed and his sleep is broken. No recent falls.    HISTORY OF PRESENT ILLNESS:  This is a 82 year old gentleman with past medical history of hypothyroidism, hypertension, arthritis who is presenting with his wife for memory problem.  Patient reported that he thinks that his memory is not what he used to be.  He does notice that he had some trouble remembering recent events.  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   Per wife, memory decline starting 2 to 3 years, and now getting worse.  He asked the same questions repeatedly, sometimes he will repeat himself also.  Wife also reports an incident that happened 6 weeks ago where patient was unable to get up while in church.  At that time, he also appeared confused.  Wife reported the night before, she gave him some sleeping pills which was later discontinued by his primary care doctor, she does not remember the name.  6 days after the event he did tested positive for COVID infection.   TBI:   No past history of  TBI Stroke:   no past history of stroke Seizures:   no past history of seizures Sleep:    no history of sleep apnea but reports he does not sleep well.  Mood:  patient denies anxiety and depression  Functional status: independent in all ADLs  Patient lives with spouse   Cooking: Wife  Cleaning: Wife  Shopping: Wife  Bathing: Patient  Toileting: Patient  Driving: Drive Bills: Wife  Ever left the stove on by accident?: NO  Forget how to use items around the house?: No  Getting lost going to familiar places?: No  Forgetting loved ones names?: No  Word finding difficulty? No  Sleep: Report trouble with sleeping    OTHER MEDICAL CONDITIONS: CTS, arthritis, Hypertension, hypothyroidism   REVIEW OF SYSTEMS: Full 14 system review of systems performed and negative with exception of: as noted in the HPI   ALLERGIES: No Known Allergies  HOME MEDICATIONS: Outpatient Medications Prior to Visit  Medication Sig Dispense Refill   acetaminophen (TYLENOL) 325 MG tablet Take 650 mg by mouth as needed.     amLODipine (NORVASC) 5 MG tablet Take 5 mg by mouth daily.     benzonatate (TESSALON) 100 MG capsule Take 200 mg by mouth every 8 (eight) hours as needed for cough.     folic acid (FOLVITE) 1 MG tablet Take 1 tablet (1 mg total) by mouth daily. 90 tablet 3  hydrochlorothiazide (HYDRODIURIL) 12.5 MG tablet Take 12.5 mg by mouth daily.     levothyroxine (SYNTHROID) 125 MCG tablet Take 1 tablet (125 mcg total) by mouth daily before breakfast. 90 tablet 3   losartan (COZAAR) 100 MG tablet Take by mouth.     Multiple Vitamin (MULTIVITAMIN) capsule Take 1 capsule by mouth daily.     pantoprazole (PROTONIX) 40 MG tablet Take 40 mg by mouth 2 (two) times daily.     pantoprazole (PROTONIX) 40 MG tablet Take 1 tablet (40 mg total) by mouth 2 (two) times daily. For 8 weeks and then daily 180 tablet 3   sulfaSALAzine (AZULFIDINE) 500 MG tablet Take 2 tablets (1,000 mg total) by mouth 2 (two) times  daily. 360 tablet 4   sulfaSALAzine (AZULFIDINE) 500 MG tablet Take 2 tablets (1,000 mg total) by mouth 2 (two) times daily. 360 tablet 5   traZODone (DESYREL) 50 MG tablet Take 50 mg by mouth at bedtime.     donepezil (ARICEPT) 5 MG tablet Take 1 tablet (5 mg total) by mouth at bedtime. 30 tablet 11   memantine (NAMENDA) 10 MG tablet Take 10 mg by mouth daily. (Patient not taking: Reported on 09/25/2022)     No facility-administered medications prior to visit.    PAST MEDICAL HISTORY: Past Medical History:  Diagnosis Date   Bleeding ulcer    Carpal tunnel syndrome    right hand   GERD (gastroesophageal reflux disease)    History of diverticulitis    Hypothyroidism    Left sided ulcerative colitis with rectal bleeding ([place])    Thyroid cancer ([place]) 1997    PAST SURGICAL HISTORY: Past Surgical History:  Procedure Laterality Date   ANTERIOR CERVICAL DECOMP/DISCECTOMY FUSION N/A 09/23/2020   Procedure: Anterior Cervical Discectomy Fusion - Cervical Three-Cervical Four;  Surgeon: Eustace Moore, MD;  Location: [facility];  Service: Neurosurgery;  Laterality: N/A;  Anterior Cervical Discectomy Fusion - Cervical Three-Cervical Four   CARPAL TUNNEL RELEASE Right 09/23/2020   Procedure: Right carpal tunnel release;  Surgeon: Eustace Moore, MD;  Location: [facility];  Service: Neurosurgery;  Laterality: Right;  3C   CHOLECYSTECTOMY     per patient "maybe about 15 years ago"   COLONOSCOPY  09/06/2014   Left sided ulcerative colitis up to 55cm. Normal remaining colon. Normal TI. Mild pancolonic diverticulosis. Status post sigmoid resection with patent colo-colic anastomosis.    ESOPHAGOGASTRODUODENOSCOPY     Dr Melina Copa. Years ago per patient    [place] or 1997   right leg per patient    left hand surgery  01/2021   LOW ANTERIOR BOWEL RESECTION     MOUTH SURGERY     ROTATOR CUFF REPAIR     THYROIDECTOMY     TONSILLECTOMY     per patient "as a youngster"    FAMILY HISTORY: Family  History  Problem Relation Age of Onset   Colon cancer Neg Hx    Esophageal cancer Neg Hx    Pancreatic cancer Neg Hx    Stomach cancer Neg Hx    Liver disease Neg Hx     SOCIAL HISTORY: Social History   Socioeconomic History   Marital status: Married    Spouse name: Not on file   Number of children: 2   Years of education: Not on file   Highest education level: Not on file  Occupational History   Not on file  Tobacco Use   Smoking status: Never   Smokeless tobacco:  Never   Tobacco comments:    said he smoked apack in his life as a  teen  Vaping Use   Vaping Use: Never used  Substance and Sexual Activity   Alcohol use: Not Currently   Drug use: Never   Sexual activity: Not on file  Other Topics Concern   Not on file  Social History Narrative   Not on file   Social Determinants of Health   Financial Resource Strain: Not on file  Food Insecurity: Not on file  Transportation Needs: Not on file  Physical Activity: Not on file  Stress: Not on file  Social Connections: Not on file  Intimate Partner Violence: Not on file    PHYSICAL EXAM  GENERAL EXAM/CONSTITUTIONAL: Vitals:  Vitals:   09/25/22 1148 09/25/22 1150  BP: (!) 172/75 (!) 166/85  Pulse: 92 68  Weight: 204 lb 8 oz (92.8 kg)   Height: '5\' 9"'$  (1.753 m)    Body mass index is 30.2 kg/m. Wt Readings from Last 3 Encounters:  09/25/22 204 lb 8 oz (92.8 kg)  09/11/22 206 lb 6 oz (93.6 kg)  08/15/22 200 lb (90.7 kg)   Patient is in no distress; well developed, nourished and groomed; neck is supple  EYES: Visual fields full to confrontation, Extraocular movements intacts,   MUSCULOSKELETAL: Gait, strength, tone, movements noted in Neurologic exam below  NEUROLOGIC: MENTAL STATUS:      No data to display         awake, alert, oriented to person, place and time recent and remote memory intact normal attention and concentration language fluent, comprehension intact, naming intact fund of  knowledge appropriate     09/25/2022   11:52 AM 10/12/2021    2:28 PM  Montreal Cognitive Assessment   Visuospatial/ Executive (0/5) 5 5  Naming (0/3) 0 1  Attention: Read list of digits (0/2) 1 2  Attention: Read list of letters (0/1) 1 1  Attention: Serial 7 subtraction starting at 100 (0/3) 3 3  Language: Repeat phrase (0/2) 2 2  Language : Fluency (0/1) 0 1  Abstraction (0/2) 1 1  Delayed Recall (0/5) 0 0  Orientation (0/6) 6 6  Total 19 22  Adjusted Score (based on education) 19 22     CRANIAL NERVE:  2nd, 3rd, 4th, 6th - visual fields full to confrontation, extraocular muscles intact, no nystagmus 5th - facial sensation symmetric 7th - facial strength symmetric 8th - hearing intact 9th - palate elevates symmetrically, uvula midline 11th - shoulder shrug symmetric 12th - tongue protrusion midline  MOTOR:  normal bulk and tone, full strength in the BUE, BLE  SENSORY:  normal and symmetric to light touch, vibration  COORDINATION:  finger-nose-finger, fine finger movements normal  REFLEXES:  deep tendon reflexes present and symmetric  GAIT/STATION:  normal  DIAGNOSTIC DATA (LABS, IMAGING, TESTING) - I reviewed patient records, labs, notes, testing and imaging myself where available.  Lab Results  Component Value Date   WBC 8.1 09/11/2022   HGB 8.2 Repeated and verified X2. (L) 09/11/2022   HCT 24.2 (L) 09/11/2022   MCV 87.2 09/11/2022   PLT 388.0 09/11/2022      Component Value Date/Time   NA 142 05/28/2022 0927   NA 143 01/24/2022 0000   K 3.6 05/28/2022 0927   CL 104 05/28/2022 0927   CO2 27 05/28/2022 0927   GLUCOSE 126 (H) 05/28/2022 0927   BUN 23 05/28/2022 0927   BUN 25 (A) 01/24/2022 0000  CREATININE 1.01 05/28/2022 0927   CALCIUM 8.8 (L) 05/28/2022 0927   PROT 6.8 05/28/2022 0927   ALBUMIN 3.8 05/28/2022 0927   AST 27 05/28/2022 0927   ALT 17 05/28/2022 0927   ALKPHOS 118 05/28/2022 0927   BILITOT 0.5 05/28/2022 0927   GFRNONAA >60  05/28/2022 0927   No results found for: "CHOL", "HDL", "LDLCALC", "LDLDIRECT", "TRIG", "CHOLHDL" Lab Results  Component Value Date   HGBA1C 6.2 01/24/2021   Lab Results  Component Value Date   VITAMINB12 593 09/27/2021   Lab Results  Component Value Date   TSH 7.006 (H) 06/01/2022    Head CT 09/19/2021 Atrophy which is most severe in the medial temporal lobe bilaterally.  Moderate chronic microvascular ischemic change in the white matter.    ASSESSMENT AND PLAN  82 y.o. year old male with hypothyroid, hypertension who is presenting for his Alzheimer disease.  He is on Aricept 5 mg nightly, we will increase it to 10 mg nightly.  His PCP has started him on memantine, advised patient to start memantine 10 mg twice daily.  We also discussed need for exercise at least 20-minutes of walking 5 days a week.  He voices understanding.  I will see them in 1 year for follow-up.  Advised wife to contact me if he has worsening insomnia, new agitation, delusion or hallucinations.  They voiced understanding.  I have also provided them with information regarding the [facility] [place].     1. Mild late onset Alzheimer's dementia without behavioral disturbance, psychotic disturbance, mood disturbance, or anxiety ([place])   2. Hypertension, unspecified type      Patient Instructions  Increase Aricept to 10 mg nightly Start Namenda 10 mg twice daily Start exercise, at least 20 minutes a day 5 days a week Continue your other medications Information regarding [facility] [place] given to patient and wife I will see him in 1 year for follow-up    No orders of the defined types were placed in this encounter.   Meds ordered this encounter  Medications   donepezil (ARICEPT) 10 MG tablet    Sig: Take 1 tablet (10 mg total) by mouth at bedtime.    Dispense:  90 tablet    Refill:  3   memantine (NAMENDA) 10 MG tablet    Sig: Take 1 tablet (10 mg total) by mouth 2  (two) times daily.    Dispense:  180 tablet    Refill:  3    Return in about 1 year (around 09/25/2023).   Alric Ran, MD 09/25/2022, 4:43 PM  [facility] [address] [place], [place] [address] [phone]

## 2022-09-25 NOTE — Patient Instructions (Signed)
Increase Aricept to 10 mg nightly Start Namenda 10 mg twice daily Start exercise, at least 20 minutes a day 5 days a week Continue your other medications Information regarding [facility] [place] given to patient and wife I will see him in 1 year for follow-up

## 2022-10-16 ENCOUNTER — Telehealth: Payer: Self-pay | Admitting: Neurology

## 2022-10-16 NOTE — Telephone Encounter (Signed)
Called and relayed message to patient, he states he wrote down note for wife and will let us know if the changes do not help the symptoms. Pt verbalized understanding.Pt had no questions at this time but was encouraged to call back if questions arise.

## 2022-10-16 NOTE — Telephone Encounter (Signed)
Please advise wife to hold the Namenda and to continue with Aricept.

## 2022-10-16 NOTE — Telephone Encounter (Signed)
Pt wife called. Stated pt isn't taking medication because he doesn't like the way it makes him feels. She is requesting a nurse call to discuss maybe changing this medication.

## 2022-10-17 ENCOUNTER — Telehealth: Payer: Self-pay

## 2022-10-17 DIAGNOSIS — D649 Anemia, unspecified: Secondary | ICD-10-CM

## 2022-10-17 DIAGNOSIS — K922 Gastrointestinal hemorrhage, unspecified: Secondary | ICD-10-CM

## 2022-10-17 DIAGNOSIS — K5150 Left sided colitis without complications: Secondary | ICD-10-CM

## 2022-10-17 NOTE — Telephone Encounter (Signed)
Labs for CBC and iron done and sent to PCP. He can go to the PCP and get them done from anytime from 830am to 5pm but they close for lunch from 12pm-2pm per receptionist. LVM for patient to call back

## 2022-10-18 NOTE — Telephone Encounter (Signed)
Spoke to wife and made aware

## 2022-10-20 ENCOUNTER — Other Ambulatory Visit: Payer: Self-pay | Admitting: Neurology

## 2022-10-24 ENCOUNTER — Other Ambulatory Visit: Payer: Self-pay | Admitting: Neurology

## 2022-10-25 DIAGNOSIS — D649 Anemia, unspecified: Secondary | ICD-10-CM | POA: Diagnosis not present

## 2022-10-25 DIAGNOSIS — K922 Gastrointestinal hemorrhage, unspecified: Secondary | ICD-10-CM | POA: Diagnosis not present

## 2022-10-25 DIAGNOSIS — K5150 Left sided colitis without complications: Secondary | ICD-10-CM | POA: Diagnosis not present

## 2022-11-12 ENCOUNTER — Telehealth: Payer: Self-pay | Admitting: Gastroenterology

## 2022-11-12 NOTE — Telephone Encounter (Signed)
Discussed in detail with Devin [name]  CBC from Dr. Marnee Guarneri office dated 10/25/2022 showed hemoglobin 8.7, MCV 77 with iron studies consistent with IDA saturation 11%  I called Devin [name]. -No rectal bleeding or bleeding from any other site -Good appetite -No weight loss -He is not taking any nonsteroidals. -She was not sure if patient is taking iron supplements    Plan: -Devin [name] will make sure he is taking iron supplements twice daily -She will make appointment with [facility] (Dr. Jeanie [name] is leaving.  However, new physician is joining) -She will get blood work (as a part of physical) done at [facility] and send copy to [place]. -Also needs follow-up here     Devin [name], Can you make sure patient has follow-up end of May or beginning of June 202 RG

## 2022-11-12 NOTE — Telephone Encounter (Signed)
Scheduled for 12-26-2022 with wife

## 2022-11-16 NOTE — Telephone Encounter (Signed)
Spoke with patient's wife & she stated routine physical was already completed in March of this year. Recent lab work has been reviewed by Dr. Chales Abrahams. Advised that she keep patient appointment in June. She verbalized all understanding.

## 2022-11-16 NOTE — Telephone Encounter (Signed)
Dr. Jeanie Sewer called & stated patient came to their office today (no appointment) and was unsure what was needed from him. Last CBC was already faxed to our office & reviewed by Dr. Chales Abrahams, and he is wondering if there is anything else needed from his office. Will route to MD.

## 2022-11-16 NOTE — Telephone Encounter (Signed)
Just a routine physical with Dr. Jeanie Sewer or his partner RG

## 2022-12-26 ENCOUNTER — Ambulatory Visit: Payer: Medicare Other | Admitting: Gastroenterology

## 2023-02-02 DIAGNOSIS — R0981 Nasal congestion: Secondary | ICD-10-CM | POA: Diagnosis not present

## 2023-02-02 DIAGNOSIS — R062 Wheezing: Secondary | ICD-10-CM | POA: Diagnosis not present

## 2023-02-02 DIAGNOSIS — R519 Headache, unspecified: Secondary | ICD-10-CM | POA: Diagnosis not present

## 2023-02-02 DIAGNOSIS — R509 Fever, unspecified: Secondary | ICD-10-CM | POA: Diagnosis not present

## 2023-02-25 ENCOUNTER — Encounter: Payer: Self-pay | Admitting: Gastroenterology

## 2023-02-25 ENCOUNTER — Other Ambulatory Visit (INDEPENDENT_AMBULATORY_CARE_PROVIDER_SITE_OTHER): Payer: Medicare Other

## 2023-02-25 ENCOUNTER — Ambulatory Visit (INDEPENDENT_AMBULATORY_CARE_PROVIDER_SITE_OTHER): Payer: Medicare Other | Admitting: Gastroenterology

## 2023-02-25 VITALS — BP 134/70 | HR 73 | Ht 69.5 in | Wt 202.0 lb

## 2023-02-25 DIAGNOSIS — Z8719 Personal history of other diseases of the digestive system: Secondary | ICD-10-CM

## 2023-02-25 DIAGNOSIS — K922 Gastrointestinal hemorrhage, unspecified: Secondary | ICD-10-CM | POA: Diagnosis not present

## 2023-02-25 DIAGNOSIS — D649 Anemia, unspecified: Secondary | ICD-10-CM

## 2023-02-25 DIAGNOSIS — K5150 Left sided colitis without complications: Secondary | ICD-10-CM

## 2023-02-25 LAB — COMPREHENSIVE METABOLIC PANEL
ALT: 17 U/L (ref 0–53)
AST: 24 U/L (ref 0–37)
Albumin: 4 g/dL (ref 3.5–5.2)
Alkaline Phosphatase: 130 U/L — ABNORMAL HIGH (ref 39–117)
BUN: 20 mg/dL (ref 6–23)
CO2: 24 mEq/L (ref 19–32)
Calcium: 9 mg/dL (ref 8.4–10.5)
Chloride: 106 mEq/L (ref 96–112)
Creatinine, Ser: 1.08 mg/dL (ref 0.40–1.50)
GFR: 64.11 mL/min (ref >60.00)
Glucose, Bld: 103 mg/dL — ABNORMAL HIGH (ref 70–99)
Potassium: 4.1 mEq/L (ref 3.5–5.1)
Sodium: 136 mEq/L (ref 135–145)
Total Bilirubin: 0.3 mg/dL (ref 0.2–1.2)
Total Protein: 6.8 g/dL (ref 6.0–8.3)

## 2023-02-25 LAB — CBC WITH DIFFERENTIAL/PLATELET
Basophils Absolute: 0.1 10*3/uL (ref 0.0–0.1)
Basophils Relative: 0.6 % (ref 0.0–3.0)
Eosinophils Absolute: 0.5 10*3/uL (ref 0.0–0.7)
Eosinophils Relative: 5.5 % — ABNORMAL HIGH (ref 0.0–5.0)
HCT: 36 % — ABNORMAL LOW (ref 39.0–52.0)
Hemoglobin: 11.8 g/dL — ABNORMAL LOW (ref 13.0–17.0)
Lymphocytes Relative: 20.3 % (ref 12.0–46.0)
Lymphs Abs: 1.7 10*3/uL (ref 0.7–4.0)
MCHC: 32.7 g/dL (ref 30.0–36.0)
MCV: 85.2 fl (ref 78.0–100.0)
Monocytes Absolute: 1 10*3/uL (ref 0.1–1.0)
Monocytes Relative: 11.8 % (ref 3.0–12.0)
Neutro Abs: 5.2 10*3/uL (ref 1.4–7.7)
Neutrophils Relative %: 61.8 % (ref 43.0–77.0)
Platelets: 324 10*3/uL (ref 150.0–400.0)
RBC: 4.23 Mil/uL (ref 4.22–5.81)
RDW: 18.9 % — ABNORMAL HIGH (ref 11.5–15.5)
WBC: 8.5 10*3/uL (ref 4.0–10.5)

## 2023-02-25 LAB — IBC + FERRITIN
Ferritin: 23.7 ng/mL (ref 22.0–322.0)
Iron: 49 ug/dL (ref 42–165)
Saturation Ratios: 14.8 % — ABNORMAL LOW (ref 20.0–50.0)
TIBC: 330.4 ug/dL (ref 250.0–450.0)
Transferrin: 236 mg/dL (ref 212.0–360.0)

## 2023-02-25 MED ORDER — SULFASALAZINE 500 MG PO TABS: 1000.0000 mg | ORAL_TABLET | Freq: Two times a day (BID) | ORAL | 5 refills | Status: DC

## 2023-02-25 MED ORDER — FOLIC ACID 1 MG PO TABS: 1.0000 mg | ORAL_TABLET | Freq: Every day | ORAL | 3 refills | Status: DC

## 2023-02-25 NOTE — Patient Instructions (Addendum)
_______________________________________________________  If your blood pressure at your visit was 140/90 or greater, please contact your primary care physician to follow up on this.  _______________________________________________________  If you are age 82 or older, your body mass index should be between 23-30. Your Body mass index is 29.4 kg/m. If this is out of the aforementioned range listed, please consider follow up with your Primary Care Provider.  If you are age 76 or younger, your body mass index should be between 19-25. Your Body mass index is 29.4 kg/m. If this is out of the aformentioned range listed, please consider follow up with your Primary Care Provider.   ________________________________________________________  The [place] [facility] providers would like to encourage you to use [facility] to communicate with providers for non-urgent requests or questions.  Due to long hold times on the telephone, sending your provider a message by [facility] may be a faster and more efficient way to get a response.  Please allow 48 business hours for a response.  Please remember that this is for non-urgent requests.  _______________________________________________________  Your provider has requested that you go to the basement level for lab work before leaving today. Press "B" on the elevator. The lab is located at the first door on the left as you exit the elevator.  We have sent the following medications to your pharmacy for you to pick up at your convenience: 2 prescriptions  Please follow up in 12 months. Give [place] a call at [phone] to schedule an appointment.  Thank you,  Dr. Lynann Bologna

## 2023-02-25 NOTE — Progress Notes (Signed)
Chief Complaint: FU  Referring Provider: Dr Samuel Germany      ASSESSMENT AND PLAN;   #1.  UGI bleed d/t DU. EGD 09/02/2022 showed DU, neg HP. Due to nonsteroidals.  #2.  Left-sided ulcerative colitis Dx 2011.  #3.  H/O Diverticulitis on CT History of diverticulitis s/p LAR 1/98.  H/O recent diverticulitis 2020.  #4. Mild anemia-followed by Dr. Gilman Buttner  Plan: -Protonix 40mg  po QD #90, 4RF -CBC, CMP, iron studies -Continue sulfasalazine 500 mg/tab. 2 tablets p.o. twice daily #360, 4 refills.  Discussed S/Es including photosensitivity. -Folic 1mg  po qd. -FU in June 2025  HPI:    Devin [name] is a 82 y.o. male  For follow-up  Doing better.  Had melena Feb 2024 Admitted at [facility] Hb 8.6 to 7.7 (baseline 11-12) S/P EGD 09/02/2022- DU.  Neg HP Meloxicam/Aleve stopped Started on Protonix 40 twice daily for 8 weeks then once a day.  For follow-up Doing very well.  No nausea, vomiting, heartburn, regurgitation, odynophagia or dysphagia.  No significant diarrhea or constipation.  No further melena or hematochezia. No unintentional weight loss. No abdominal pain.  Seen by Dr. Gilman Buttner previously.  Has mild anemia Hb 12, likely myelodysplasia.  Also due to history of previous polyps and L sided UC, he was on recall colon 2023. He would like to wait and get it done if he has heme positive stools or any problems.  Wt Readings from Last 3 Encounters:  02/25/23 202 lb (91.6 kg)  09/25/22 204 lb 8 oz (92.8 kg)  09/11/22 206 lb 6 oz (93.6 kg)     Previous GI procedures:  EGD 09/02/2022 at [facility] Impression: - Esophageal stenosis. - Normal stomach. Biopsied. - Non-bleeding duodenal ulcer with no stigmata of  bleeding. This is the suspected source of his  presenting symptoms. -Bx- neg for HP.   -Colonoscopy 07/2018, minimal smudged vascular pattern up to 55 cm.  No active colitis.  Pancolonic diverticulosis.  Bx- neg. small tubular adenoma s/p polypectomy.  08/2014: Left-sided  ulcerative colitis up to 55 cm, normal remaining colon, normal TI.  Mild pancolonic diverticulosis, status post sigmoid resection with patent anastomosis. - CT AP 05/2019: No acute findings.  Postoperative findings of sigmoid colon resection and anastomosis.  Occasional diverticula.  Also noted is a diverticulum in the descending duodenum.  Additional social history: Married to [place], self-employed, owns [place] Daughter Esaw Grandchild and son Orvilla Fus Past Medical History:  Diagnosis Date   GERD (gastroesophageal reflux disease)    History of diverticulitis    Hypothyroidism    Left sided ulcerative colitis with rectal bleeding [facility])     Past Surgical History:  Procedure Laterality Date   COLONOSCOPY  09/06/2014   Left sided ulcerative colitis up to 55cm. Normal remaining colon. Normal TI. Mild pancolonic diverticulosis. Status post sigmoid resection with patent colo-colic anastomosis.    ESOPHAGOGASTRODUODENOSCOPY     Dr Charm Barges. Years ago per patient    LOW ANTERIOR BOWEL RESECTION     MOUTH SURGERY     ROTATOR CUFF REPAIR     THYROIDECTOMY      Family History  Problem Relation Age of Onset   Colon cancer Neg Hx    Esophageal cancer Neg Hx     Social History   Tobacco Use   Smoking status: Never Smoker   Smokeless tobacco: Never Used   Tobacco comment: said he smoked apack in his life as a  teen  Substance Use Topics   Alcohol  use: Not Currently   Drug use: Never    Current Outpatient Medications  Medication Sig Dispense Refill   clonazePAM (KLONOPIN) 0.5 MG tablet Take 0.5 mg by mouth at bedtime as needed. for sleep     levothyroxine (SYNTHROID) 137 MCG tablet Take by mouth daily.     losartan (COZAAR) 50 MG tablet Take 50 mg by mouth daily.  12   sulfaSALAzine (AZULFIDINE) 500 MG tablet Take 2 tablets (1,000 mg total) by mouth 2 (two) times daily. 120 tablet 11   No current facility-administered medications for this visit.     Not on File  Review of  Systems:  neg     Physical Exam:    Ht 5' 9.5" (1.765 m)   Wt 193 lb (87.5 kg)   BMI 28.09 kg/m  Filed Weights   06/15/19 1118  Weight: 193 lb (87.5 kg)   Gen: awake, alert, NAD HEENT: anicteric, no pallor CV: RRR, no mrg Pulm: CTA b/l Abd: soft, NT/ND, +BS throughout Ext: no c/c/e Neuro: nonfocal    Edman Circle, MD 06/15/2019, 11:32 AM  Cc: Dr. Samuel Germany

## 2023-03-04 ENCOUNTER — Telehealth: Payer: Self-pay | Admitting: Gastroenterology

## 2023-03-04 NOTE — Telephone Encounter (Signed)
Patients wife is returning your call. Asked if you would leave a detailed message because she will be stepping out to the bank.

## 2023-03-04 NOTE — Telephone Encounter (Signed)
Spoke with patient's wife, see 8/12 result note for details.

## 2023-04-01 DIAGNOSIS — F0390 Unspecified dementia without behavioral disturbance: Secondary | ICD-10-CM | POA: Diagnosis not present

## 2023-04-01 DIAGNOSIS — I10 Essential (primary) hypertension: Secondary | ICD-10-CM | POA: Diagnosis not present

## 2023-04-01 DIAGNOSIS — Z79899 Other long term (current) drug therapy: Secondary | ICD-10-CM | POA: Diagnosis not present

## 2023-04-01 DIAGNOSIS — E039 Hypothyroidism, unspecified: Secondary | ICD-10-CM | POA: Diagnosis not present

## 2023-04-01 DIAGNOSIS — K5190 Ulcerative colitis, unspecified, without complications: Secondary | ICD-10-CM | POA: Diagnosis not present

## 2023-05-08 DIAGNOSIS — Z23 Encounter for immunization: Secondary | ICD-10-CM | POA: Diagnosis not present

## 2023-05-29 ENCOUNTER — Telehealth: Payer: Self-pay | Admitting: Hematology and Oncology

## 2023-05-29 ENCOUNTER — Inpatient Hospital Stay (HOSPITAL_BASED_OUTPATIENT_CLINIC_OR_DEPARTMENT_OTHER): Payer: Medicare Other | Admitting: Hematology and Oncology

## 2023-05-29 ENCOUNTER — Encounter: Payer: Self-pay | Admitting: Hematology and Oncology

## 2023-05-29 ENCOUNTER — Inpatient Hospital Stay: Payer: Medicare Other | Attending: Hematology and Oncology

## 2023-05-29 ENCOUNTER — Ambulatory Visit: Payer: Medicare Other | Admitting: Oncology

## 2023-05-29 VITALS — BP 142/73 | HR 68 | Temp 98.3°F | Resp 18 | Ht 69.5 in | Wt 198.3 lb

## 2023-05-29 DIAGNOSIS — Z9049 Acquired absence of other specified parts of digestive tract: Secondary | ICD-10-CM | POA: Insufficient documentation

## 2023-05-29 DIAGNOSIS — C73 Malignant neoplasm of thyroid gland: Secondary | ICD-10-CM | POA: Diagnosis not present

## 2023-05-29 DIAGNOSIS — D649 Anemia, unspecified: Secondary | ICD-10-CM | POA: Insufficient documentation

## 2023-05-29 DIAGNOSIS — K922 Gastrointestinal hemorrhage, unspecified: Secondary | ICD-10-CM | POA: Insufficient documentation

## 2023-05-29 DIAGNOSIS — Z85850 Personal history of malignant neoplasm of thyroid: Secondary | ICD-10-CM | POA: Insufficient documentation

## 2023-05-29 DIAGNOSIS — K5190 Ulcerative colitis, unspecified, without complications: Secondary | ICD-10-CM | POA: Insufficient documentation

## 2023-05-29 LAB — TSH: TSH: 0.8 u[IU]/mL (ref 0.350–4.500)

## 2023-05-29 LAB — CBC WITH DIFFERENTIAL (CANCER CENTER ONLY)
Abs Immature Granulocytes: 0.03 10*3/uL (ref 0.00–0.07)
Basophils Absolute: 0.1 10*3/uL (ref 0.0–0.1)
Basophils Relative: 1 %
Eosinophils Absolute: 0.3 10*3/uL (ref 0.0–0.5)
Eosinophils Relative: 5 %
HCT: 35.9 % — ABNORMAL LOW (ref 39.0–52.0)
Hemoglobin: 11.7 g/dL — ABNORMAL LOW (ref 13.0–17.0)
Immature Granulocytes: 0 %
Lymphocytes Relative: 21 %
Lymphs Abs: 1.4 10*3/uL (ref 0.7–4.0)
MCH: 29 pg (ref 26.0–34.0)
MCHC: 32.6 g/dL (ref 30.0–36.0)
MCV: 88.9 fL (ref 80.0–100.0)
Monocytes Absolute: 0.9 10*3/uL (ref 0.1–1.0)
Monocytes Relative: 13 %
Neutro Abs: 4 10*3/uL (ref 1.7–7.7)
Neutrophils Relative %: 60 %
Platelet Count: 310 10*3/uL (ref 150–400)
RBC: 4.04 MIL/uL — ABNORMAL LOW (ref 4.22–5.81)
RDW: 14.3 % (ref 11.5–15.5)
WBC Count: 6.7 10*3/uL (ref 4.0–10.5)
nRBC: 0 % (ref 0.0–0.2)
nRBC: 0 /100{WBCs}

## 2023-05-29 LAB — CMP (CANCER CENTER ONLY)
ALT: 18 U/L (ref 0–44)
AST: 29 U/L (ref 15–41)
Albumin: 4 g/dL (ref 3.5–5.0)
Alkaline Phosphatase: 165 U/L — ABNORMAL HIGH (ref 38–126)
Anion gap: 10 (ref 5–15)
BUN: 21 mg/dL (ref 8–23)
CO2: 25 mmol/L (ref 22–32)
Calcium: 9.2 mg/dL (ref 8.9–10.3)
Chloride: 106 mmol/L (ref 98–111)
Creatinine: 1.09 mg/dL (ref 0.61–1.24)
GFR, Estimated: >60 mL/min (ref >60)
Glucose, Bld: 101 mg/dL — ABNORMAL HIGH (ref 70–99)
Potassium: 4 mmol/L (ref 3.5–5.1)
Sodium: 141 mmol/L (ref 135–145)
Total Bilirubin: 0.4 mg/dL (ref <1.2)
Total Protein: 6.8 g/dL (ref 6.5–8.1)

## 2023-05-29 LAB — IRON AND TIBC
Iron: 80 ug/dL (ref 45–182)
Saturation Ratios: 26 % (ref 17.9–39.5)
TIBC: 312 ug/dL (ref 250–450)
UIBC: 232 ug/dL

## 2023-05-29 LAB — FOLATE: Folate: >40 ng/mL (ref >5.9)

## 2023-05-29 LAB — VITAMIN B12: Vitamin B-12: 509 pg/mL (ref 180–914)

## 2023-05-29 LAB — FERRITIN: Ferritin: 38 ng/mL (ref 24–336)

## 2023-05-29 NOTE — Progress Notes (Signed)
Devin [name]  [address] [place],  [place]  [address] [phone]  Clinic Day:  05/29/2023  Referring physician: Noni Saupe, MD   CHIEF COMPLAINT:  CC: Remote history of thyroid cancer  Current Treatment: Observation  HISTORY OF PRESENT ILLNESS:  Devin [name] is a 82 y.o. male with a history of history of thyroid cancer diagnosed in 44.  He presented with fever and a palpable mass.  He underwent total thyroidectomy and right radical neck dissection, followed by radioactive iodine at [name]. He transferred his care here in August 2015 and has remained without evidence of disease.  Thyroid ultrasound in July 2016 revealed normal post thyroidectomy appearance, with no evidence of recurrence.  Chest x-ray in June 2016 was normal.  He had a mammogram in September 2015 to evaluate gynecomastia and that was negative.    He has ulcerative colitis, for which he is on sulfasalazine.  Colonoscopy was done in February 2016 and revealed left sided ulcerative colitis, as well as diverticulosis, but was otherwise normal.  He underwent cholecystectomy in February 2018.  He had a colonoscopy with Devin. Devin [name] in February 2020 with resection of a tubular adenoma.  It was recommended to repeat this in 3 years.  He declined repeat colonoscopy.   He has had mild chronic mild anemia since 2015, which has slowly worsened over the years.  His hemoglobin dropped to 10.5 in March 2023  evaluation of the anemia did not reveal a specific etiology.  There was no evidence of nutritional deficiency, monoclonal gammopathy or hemolysis.  Hemoglobin improved to 11.8 in July 2023, which had been baseline since 2022.  The dose of levothyroxine was decreased to 112 mcg [name] July 2022, when his TSH was suppressed more than recommended at 0.166.  Levothyroxine was increased back to 125 mcg [name] in November 2023 when the TSH was 2.517.  INTERVAL HISTORY:  Devin [name] is here today for for  annual follow-up.  He denies progressive fatigue concerning for recurrent severe anemia.  He denies appetite changes, cold or heat intolerance.  He denies palpitations.  He denies recurrent melena or other form of overt blood loss.  He denies fevers or chills. He denies pain. His appetite is good. His weight has increased 4 pounds over last year .  His weight was 194 in November 2023.  His wife accompanies him today and states she had him take Devin [name] he stopped it on his own.  They have not wished for him to take other iron supplements due to difficulties with his bowel movements.  He was admitted in February to [facility] with upper GI bleed with melena.  He had been on meloxicam for hand pain and stopped taking his PPI.  EGD revealed a duodenal ulcer without stigmata of bleeding felt to be the most likely source of blood loss.  Pantoprazole was increased to 40 mg twice [name].  Hemoglobin was 8.6 with an MCV of 91 and ferritin 58 on admission.  The hemoglobin dropped to 7.7 at discharge.  He was not transfused.  He followed up with Devin. Devin [name] a week later and his hemoglobin was 8.2.  He saw Devin. Devin [name] again in August at which time his hemoglobin was back up to 11.8, which is his current baseline.  REVIEW OF SYSTEMS:  Review of Systems  Constitutional:  Positive for fatigue. Negative for appetite change, chills, diaphoresis, fever and unexpected weight change.  HENT:   Negative for lump/mass, mouth sores, nosebleeds, sore  throat and trouble swallowing.   Eyes:  Negative for eye problems.  Respiratory:  Negative for cough, hemoptysis and shortness of breath.   Cardiovascular:  Negative for chest pain and leg swelling.  Gastrointestinal:  Negative for abdominal pain, blood in stool, constipation, diarrhea, nausea and vomiting.  Genitourinary:  Negative for difficulty urinating, dysuria, frequency and hematuria.   Musculoskeletal:  Negative for arthralgias, back pain and myalgias.  Skin:  Negative for itching  and rash.  Neurological:  Negative for dizziness, extremity weakness, headaches, light-headedness and numbness.  Hematological:  Negative for adenopathy. Does not bruise/bleed easily.  Psychiatric/Behavioral:  Negative for depression and sleep disturbance. The patient is not nervous/anxious.      VITALS:  Blood pressure (!) 142/73, pulse 68, temperature 98.3 F (36.8 C), temperature source Oral, resp. rate 18, height 5' 9.5" (1.765 m), weight 198 lb 4.8 oz (89.9 kg), SpO2 98%.  Wt Readings from Last 3 Encounters:  05/29/23 198 lb 4.8 oz (89.9 kg)  02/25/23 202 lb (91.6 kg)  09/25/22 204 lb 8 oz (92.8 kg)    Body mass index is 28.86 kg/m.  Performance status (ECOG): 1 - Symptomatic but completely ambulatory  PHYSICAL EXAM:  Physical Exam Vitals and nursing note reviewed.  Constitutional:      General: He is not in acute distress.    Appearance: Normal appearance. He is normal weight.  HENT:     Head: Normocephalic and atraumatic.     Mouth/Throat:     Mouth: Mucous membranes are moist.     Pharynx: Oropharynx is clear. No oropharyngeal exudate or posterior oropharyngeal erythema.  Eyes:     General: No scleral icterus.    Extraocular Movements: Extraocular movements intact.     Conjunctiva/sclera: Conjunctivae normal.     Pupils: Pupils are equal, round, and reactive to light.  Cardiovascular:     Rate and Rhythm: Normal rate and regular rhythm.     Heart sounds: Normal heart sounds. No murmur heard.    No friction rub. No gallop.  Pulmonary:     Effort: Pulmonary effort is normal.     Breath sounds: Normal breath sounds. No wheezing, rhonchi or rales.  Abdominal:     General: Bowel sounds are normal. There is no distension.     Palpations: Abdomen is soft. There is no hepatomegaly, splenomegaly or mass.     Tenderness: There is no abdominal tenderness.  Musculoskeletal:        General: Normal range of motion.     Cervical back: Normal range of motion and neck supple.  No tenderness.     Right lower leg: No edema.     Left lower leg: No edema.  Lymphadenopathy:     Cervical: No cervical adenopathy.     Upper Body:     Right upper body: No supraclavicular or axillary adenopathy.     Left upper body: No supraclavicular or axillary adenopathy.     Lower Body: No right inguinal adenopathy. No left inguinal adenopathy.  Skin:    General: Skin is warm and dry.     Coloration: Skin is not jaundiced.     Findings: No rash.  Neurological:     Mental Status: He is [name] and oriented to person, place, and time.     Cranial Nerves: No cranial nerve deficit.  Psychiatric:        Mood and Affect: Mood normal.        Behavior: Behavior normal.  Thought Content: Thought content normal.    LABS:      Latest Ref Rng & Units 05/29/2023    9:36 AM 02/25/2023   11:03 AM 09/11/2022   11:11 AM  CBC  WBC 4.0 - 10.5 K/uL 6.7  8.5  8.1   Hemoglobin 13.0 - 17.0 g/dL 84.1  32.4  8.2 Repeated and verified X2.   Hematocrit 39.0 - 52.0 % 35.9  36.0  24.2   Platelets 150 - 400 K/uL 310  324.0  388.0       Latest Ref Rng & Units 05/29/2023    9:36 AM 02/25/2023   11:03 AM 05/28/2022    9:27 AM  CMP  Glucose 70 - 99 mg/dL 401  027  253   BUN 8 - 23 mg/dL 21  20  23    Creatinine 0.61 - 1.24 mg/dL 6.64  4.03  4.74   Sodium 135 - 145 mmol/L 141  136  142   Potassium 3.5 - 5.1 mmol/L 4.0  4.1  3.6   Chloride 98 - 111 mmol/L 106  106  104   CO2 22 - 32 mmol/L 25  24  27    Calcium 8.9 - 10.3 mg/dL 9.2  9.0  8.8   Total Protein 6.5 - 8.1 g/dL 6.8  6.8  6.8   Total Bilirubin <1.2 mg/dL 0.4  0.3  0.5   Alkaline Phos 38 - 126 U/L 165  130  118   AST 15 - 41 U/L 29  24  27    ALT 0 - 44 U/L 18  17  17        Lab Results  Component Value Date   TOTALPROTELP 6.5 09/27/2021   ALBUMINELP 3.5 09/27/2021   A1GS 0.2 09/27/2021   A2GS 0.8 09/27/2021   BETS 0.8 09/27/2021   GAMS 1.3 09/27/2021   MSPIKE Not Observed 09/27/2021   SPEI Comment 09/27/2021   Lab Results   Component Value Date   TIBC 330.4 02/25/2023   TIBC 298 09/27/2021   TIBC 273 01/24/2021   FERRITIN 23.7 02/25/2023   FERRITIN 61 09/27/2021   FERRITIN 109 01/24/2021   IRONPCTSAT 14.8 (L) 02/25/2023   IRONPCTSAT 18 09/27/2021   IRONPCTSAT 12 01/24/2021    STUDIES:  No results found.    HISTORY:   Past Medical History:  Diagnosis Date   Bleeding ulcer    Carpal tunnel syndrome    right hand   GERD (gastroesophageal reflux disease)    History of diverticulitis    Hypothyroidism    Left sided ulcerative colitis with rectal bleeding (HCC)    Thyroid cancer (HCC) 1997    Past Surgical History:  Procedure Laterality Date   ANTERIOR CERVICAL DECOMP/DISCECTOMY FUSION N/A 09/23/2020   Procedure: Anterior Cervical Discectomy Fusion - Cervical Three-Cervical Four;  Surgeon: Devin Alert, MD;  Location: [facility] OR;  Service: Neurosurgery;  Laterality: N/A;  Anterior Cervical Discectomy Fusion - Cervical Three-Cervical Four   CARPAL TUNNEL RELEASE Right 09/23/2020   Procedure: Right carpal tunnel release;  Surgeon: Devin Alert, MD;  Location: [name] OR;  Service: Neurosurgery;  Laterality: Right;  3C   CHOLECYSTECTOMY     per patient "maybe about 15 years ago"   COLONOSCOPY  09/06/2014   Left sided ulcerative colitis up to 55cm. Normal remaining colon. Normal TI. Mild pancolonic diverticulosis. Status post sigmoid resection with patent colo-colic anastomosis.    ESOPHAGOGASTRODUODENOSCOPY     Devin [name]. Years ago per patient    FRACTURE SURGERY  1996 or 1997   right leg per patient    left hand surgery  01/2021   LOW ANTERIOR BOWEL RESECTION     MOUTH SURGERY     ROTATOR CUFF REPAIR     THYROIDECTOMY     TONSILLECTOMY     per patient "as a youngster"    Family History  Problem Relation Age of Onset   Colon cancer Neg Hx    Esophageal cancer Neg Hx    Pancreatic cancer Neg Hx    Stomach cancer Neg Hx    Liver disease Neg Hx     Social History:  reports that he has never  smoked. He has never used smokeless tobacco. He reports that he does not currently use alcohol. He reports that he does not use drugs.The patient is accompanied by his wife today.  Allergies: No Known Allergies  Current Medications: Current Outpatient Medications  Medication Sig Dispense Refill   acetaminophen (TYLENOL) 325 MG tablet Take 650 mg by mouth as needed.     amLODipine (NORVASC) 5 MG tablet Take 5 mg by mouth [name].     folic acid (FOLVITE) 1 MG tablet Take 1 tablet (1 mg total) by mouth [name]. 90 tablet 3   levothyroxine (SYNTHROID) 125 MCG tablet Take 1 tablet (125 mcg total) by mouth [name] before breakfast. 90 tablet 3   losartan (COZAAR) 100 MG tablet Take by mouth.     memantine (NAMENDA) 10 MG tablet Take 1 tablet (10 mg total) by mouth 2 (two) times [name]. 180 tablet 3   Multiple Vitamin (MULTIVITAMIN) capsule Take 1 capsule by mouth [name].     sulfaSALAzine (AZULFIDINE) 500 MG tablet Take 2 tablets (1,000 mg total) by mouth 2 (two) times [name]. 360 tablet 4   traZODone (DESYREL) 50 MG tablet Take 50 mg by mouth at bedtime.     No current facility-administered medications for this visit.     ASSESSMENT & PLAN:   Assessment & Plan: Arhan Cervantes is a 82 y.o. male with   1.  History of papillary thyroid cancer diagnosed in 1997, treated with surgery and iodine 131.  He remains without evidence of recurrence.  His TSH is 0.8 today.  Will continue levothyroxine 125 mcg [name].  2.  Chronic anemia with recent upper GI bleed causing severe anemia.  His hemoglobin is back to baseline.  His ferritin is up to 38 today.  B12 and folate are normal.  I will have him continue Geritol.  3.  Recent upper GI bleed likely secondary to duodenal ulcer on meloxicam.  He denies any NSAID use.  He continues pantoprazole twice [name].  We will plan to see him back in 1 year for repeat clinical assessment.  The patient and his wife understand the plans discussed today and are in  agreement with them.  He knows to contact our office if he develops concerns prior to his next appointment.     I provided 40 minutes of face-to-face time during this encounter and > 50% was spent counseling as documented under my assessment and plan.    Devin Perl, PA-C  [name] - A DEPT OF [facility] [address] [place] [address] Dept: [phone] Dept Fax: [phone]   Orders Placed This Encounter  Procedures   Ferritin    Standing Status:   Future    Number of Occurrences:   1    Standing Expiration Date:  05/28/2024   Iron and TIBC    Standing Status:   Future    Number of Occurrences:   1    Standing Expiration Date:   05/28/2024   Folate    Standing Status:   Future    Number of Occurrences:   1    Standing Expiration Date:   05/28/2024   Vitamin B12    Standing Status:   Future    Number of Occurrences:   1    Standing Expiration Date:   05/28/2024   Haptoglobin    Standing Status:   Future    Number of Occurrences:   1    Standing Expiration Date:   05/28/2024

## 2023-05-29 NOTE — Telephone Encounter (Signed)
05/29/23 Spoke with patient and confirmed next appt.

## 2023-05-29 NOTE — Assessment & Plan Note (Deleted)
Mild chronic anemia since 2015.  His ferritin had drifted down from 61 in March to 23 in August, so I will repeat that today, as well as B12 and folate.  We could consider IV iron if necessary.

## 2023-05-30 ENCOUNTER — Telehealth: Payer: Self-pay

## 2023-05-30 LAB — HAPTOGLOBIN: Haptoglobin: 165 mg/dL (ref 38–329)

## 2023-05-30 MED ORDER — LEVOTHYROXINE SODIUM 125 MCG PO TABS: 125.0000 ug | ORAL_TABLET | Freq: Every day | ORAL | 3 refills | Status: DC

## 2023-05-30 NOTE — Telephone Encounter (Signed)
-----   Message from Adah Perl sent at 05/30/2023  8:54 AM EST ----- Please let his wife know TSH is in good range, continue same dose of levothyroxine. Iron stores are adequate, but I would like him to take the [facility] if he will. I will send in levothyroxine rx. Thanks

## 2023-05-30 NOTE — Telephone Encounter (Signed)
Wife notified and voiced understanding.

## 2023-10-01 ENCOUNTER — Ambulatory Visit: Payer: Medicare Other | Admitting: Neurology

## 2023-10-31 ENCOUNTER — Other Ambulatory Visit: Payer: Self-pay | Admitting: *Deleted

## 2023-10-31 DIAGNOSIS — I723 Aneurysm of iliac artery: Secondary | ICD-10-CM

## 2023-11-13 ENCOUNTER — Ambulatory Visit: Payer: Medicare Other | Attending: Vascular Surgery | Admitting: Physician Assistant

## 2023-11-13 ENCOUNTER — Ambulatory Visit (HOSPITAL_COMMUNITY)
Admission: RE | Admit: 2023-11-13 | Discharge: 2023-11-13 | Disposition: A | Discharge status: Home / Self Care | Payer: Medicare Other | Source: Ambulatory Visit | Attending: Vascular Surgery | Admitting: Vascular Surgery

## 2023-11-13 VITALS — BP 186/73 | HR 54 | Temp 97.6°F | Wt 195.3 lb

## 2023-11-13 DIAGNOSIS — I723 Aneurysm of iliac artery: Secondary | ICD-10-CM | POA: Insufficient documentation

## 2023-11-13 NOTE — Progress Notes (Signed)
 Office Note     CC:  follow up Requesting Provider:  Harvest Lineman, MD  HPI: Devin [name] is a 83 y.o. ([birth year]) male who presents for surveillance follow up of left iliac artery aneurysm. We have been following this since 2022. Initially seen on CT at 3.2 cm. Duplex last year showed maximum diameter of 2.9 cm. He has been without any associated symptoms.  Today he is here with his wife. He reports very minimal groin discomfort occasionally when he first gets up in the morning. This resolves after a few moments once he has been up walking. He also says he has some chronic low back pain but no new pain. No abdominal pain. No pain on ambulation in his legs, no pain at rest or pain that wakes him from sleep. No non healing wounds. 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  Past Medical History:  Diagnosis Date   Bleeding ulcer    Carpal tunnel syndrome    right hand   GERD (gastroesophageal reflux disease)    History of diverticulitis    Hypothyroidism    Left sided ulcerative colitis with rectal bleeding (HCC)    Thyroid  cancer (HCC) 1997    Past Surgical History:  Procedure Laterality Date   ANTERIOR CERVICAL DECOMP/DISCECTOMY FUSION N/A 09/23/2020   Procedure: Anterior Cervical Discectomy Fusion - Cervical Three-Cervical Four;  Surgeon: Isadora Mar, MD;  Location: [facility] OR;  Service: Neurosurgery;  Laterality: N/A;  Anterior Cervical Discectomy Fusion - Cervical Three-Cervical Four   CARPAL TUNNEL RELEASE Right 09/23/2020   Procedure: Right carpal tunnel release;  Surgeon: Isadora Mar, MD;  Location: [facility] OR;  Service: Neurosurgery;  Laterality: Right;  3C   CHOLECYSTECTOMY     per patient "maybe about 15 years ago"   COLONOSCOPY  09/06/2014   Left sided ulcerative colitis up to 55cm. Normal remaining colon. Normal TI. Mild pancolonic diverticulosis. Status post sigmoid resection with patent colo-colic anastomosis.    ESOPHAGOGASTRODUODENOSCOPY     Dr Randal Bury. Years  ago per patient    FRACTURE SURGERY  1996 or 1997   right leg per patient    left hand surgery  01/2021   LOW ANTERIOR BOWEL RESECTION     MOUTH SURGERY     ROTATOR CUFF REPAIR     THYROIDECTOMY     TONSILLECTOMY     per patient "as a youngster"    Social History   Socioeconomic History   Marital status: Married    Spouse name: Not on file   Number of children: 2   Years of education: Not on file   Highest education level: Not on file  Occupational History   Not on file  Tobacco Use   Smoking status: Never   Smokeless tobacco: Never   Tobacco comments:    said he smoked apack in his life as a  teen  Vaping Use   Vaping status: Never Used  Substance and Sexual Activity   Alcohol use: Not Currently   Drug use: Never   Sexual activity: Not on file  Other Topics Concern   Not on file  Social History Narrative   Not on file   Social Drivers of Health   Financial Resource Strain: Low Risk  (09/05/2022)   Received from [facility], [facility]   Overall Financial Resource Strain (CARDIA)    Difficulty of Paying Living Expenses: Not hard at all  Food Insecurity: No Food Insecurity (09/05/2022)   Received  from [facility], [facility]   Hunger Vital Sign    Worried About Running Out of Food in the Last Year: Never true    Ran Out of Food in the Last Year: Never true  Transportation Needs: No Transportation Needs (09/05/2022)   Received from [facility], [facility]   [facility] - Transportation    In the past 12 months, has lack of transportation kept you from medical appointments or from getting medications?: No    Lack of Transportation (Non-Medical): No  Physical Activity: Not on file  Stress: Not on file  Social Connections: Not on file  Intimate Partner Violence: Not on file    Family History  Problem Relation Age of Onset   Colon cancer Neg Hx    Esophageal  cancer Neg Hx    Pancreatic cancer Neg Hx    Stomach cancer Neg Hx    Liver disease Neg Hx     Current Outpatient Medications  Medication Sig Dispense Refill   acetaminophen  (TYLENOL ) 325 MG tablet Take 650 mg by mouth as needed.     amLODipine (NORVASC) 5 MG tablet Take 5 mg by mouth daily.     donepezil  (ARICEPT ) 5 MG tablet Take 5 mg by mouth daily.     folic acid  (FOLVITE ) 1 MG tablet Take 1 tablet (1 mg total) by mouth daily. 90 tablet 3   levothyroxine  (SYNTHROID ) 125 MCG tablet Take 1 tablet (125 mcg total) by mouth daily before breakfast. 90 tablet 3   losartan  (COZAAR ) 100 MG tablet Take by mouth.     Multiple Vitamin (MULTIVITAMIN) capsule Take 1 capsule by mouth daily.     SODIUM FLUORIDE 5000 PPM 1.1 % GEL dental gel as directed.     sulfaSALAzine  (AZULFIDINE ) 500 MG tablet Take 2 tablets (1,000 mg total) by mouth 2 (two) times daily. 360 tablet 4   traZODone (DESYREL) 50 MG tablet Take 50 mg by mouth at bedtime.     memantine  (NAMENDA ) 10 MG tablet Take 1 tablet (10 mg total) by mouth 2 (two) times daily. 180 tablet 3   No current facility-administered medications for this visit.    No Known Allergies   REVIEW OF SYSTEMS:  [X]  denotes positive finding, [ ]  denotes negative finding Cardiac  Comments:  Chest pain or chest pressure:    Shortness of breath upon exertion:    Short of breath when lying flat:    Irregular heart rhythm:        Vascular    Pain in calf, thigh, or hip brought on by ambulation:    Pain in feet at night that wakes you up from your sleep:     Blood clot in your veins:    Leg swelling:         Pulmonary    Oxygen  at home:    Productive cough:     Wheezing:         Neurologic    Sudden weakness in arms or legs:     Sudden numbness in arms or legs:     Sudden onset of difficulty speaking or slurred speech:    Temporary loss of vision in one eye:     Problems with dizziness:         Gastrointestinal    Blood in stool:     Vomited  blood:         Genitourinary    Burning when urinating:  Blood in urine:        Psychiatric    Major depression:         Hematologic    Bleeding problems:    Problems with blood clotting too easily:        Skin    Rashes or ulcers:        Constitutional    Fever or chills:      PHYSICAL EXAMINATION:  Vitals:   11/13/23 0938  BP: (!) 186/73  Pulse: (!) 54  Temp: 97.6 F (36.4 C)  TempSrc: Temporal  SpO2: 97%  Weight: 195 lb 4.8 oz (88.6 kg)    General:  WDWN in NAD; vital signs documented above Gait: Normal HENT: WNL, normocephalic Pulmonary: normal non-labored breathing without wheezing Cardiac: regular HR Abdomen: soft, NT, No palpable AAA Vascular Exam/Pulses: 2+ femoral, 2+ poplitea, 2+ DP pulses bilaterally. Feet warm and well perfused Extremities: without ischemic changes, without Gangrene , without cellulitis; without open wounds;  Musculoskeletal: no muscle wasting or atrophy  Neurologic: A&O X 3 Psychiatric:  The pt has Normal affect.   Non-Invasive Vascular Imaging:   Abdominal Aorta Findings:  +-------------+-------+----------+-------+---------+--------+--------------  ----+  Location    AP (cm)Trans (cm)PSV    Waveform ThrombusComments                                           (cm/s)                                       +-------------+-------+----------+-------+---------+--------+--------------  ----+  Proximal    2.54   2.16      95     triphasic                             +-------------+-------+----------+-------+---------+--------+--------------  ----+  Mid         1.89   2.00      94     triphasic                             +-------------+-------+----------+-------+---------+--------+--------------  ----+  Distal      1.82   1.76      69     triphasic                             +-------------+-------+----------+-------+---------+--------+--------------  ----+  RT CIA Prox  1.3    1.2        103    biphasic                              +-------------+-------+----------+-------+---------+--------+--------------  ----+  RT CIA Mid   1.0    1.0       95     biphasic                              +-------------+-------+----------+-------+---------+--------+--------------  ----+  RT CIA Distal  not  visualized      +-------------+-------+----------+-------+---------+--------+--------------  ----+  LT CIA Prox  1.6    1.6       82     biphasic                              +-------------+-------+----------+-------+---------+--------+--------------  ----+  LT CIA Mid   2.95   3.1       227                     approximately  3.76                                                       cm in length         +-------------+-------+----------+-------+---------+--------+--------------  ----+  LT CIA Distal1.0    1.1       176                                          +-------------+-------+----------+-------+---------+--------+--------------  ----+   Summary:  Abdominal Aorta: The largest aortic diameter has increased compared to prior exam. Previous diameter measurement was 2.7 x 2.9 cm obtained on 08/15/2022    ASSESSMENT/PLAN:: 83 y.o. male here for surveillance follow up for left common iliac artery aneurysm. The aneurysm is stable overall on duplex evaluation. Duplex today shows slight interval increase from prior duplex with maximum diameter of 3.1 cm. Previously 2.9 cm.He is without any associated back or abdominal pain. He has no claudication, rest pain or tissue loss. No intervention is indicated at this time.  - Reviewed the signs and symptoms of rupture that would require emergent medical evaluation  - Reviewed that he may potentially require endovascular repair at 3.5 cm or open repair at 4.0 cm  - Recommend follow up in 1 year with repeat aorto/ iliac duplex   Deneen Finical,  PA-C [facility] [phone]  Clinic MD:   Vikki Graves

## 2023-12-23 ENCOUNTER — Encounter: Payer: Self-pay | Admitting: Neurology

## 2023-12-23 ENCOUNTER — Ambulatory Visit (INDEPENDENT_AMBULATORY_CARE_PROVIDER_SITE_OTHER): Admitting: Neurology

## 2023-12-23 ENCOUNTER — Telehealth: Payer: Self-pay | Admitting: Neurology

## 2023-12-23 VITALS — BP 113/63 | HR 76 | Resp 15 | Ht 69.5 in | Wt 200.0 lb

## 2023-12-23 DIAGNOSIS — F02A0 Dementia in other diseases classified elsewhere, mild, without behavioral disturbance, psychotic disturbance, mood disturbance, and anxiety: Secondary | ICD-10-CM | POA: Diagnosis not present

## 2023-12-23 DIAGNOSIS — G301 Alzheimer's disease with late onset: Secondary | ICD-10-CM

## 2023-12-23 NOTE — Progress Notes (Signed)
 [facility]  PATIENT: Devin [name] DOB: [birth year]  REQUESTING CLINICIAN: Madelyne Schiff, MD HISTORY FROM: Patient and spouse  REASON FOR VISIT: Memory problems    HISTORICAL  CHIEF COMPLAINT:  Chief Complaint  Patient presents with   Memory Loss    Rm12, alone, Mild late onset Alzeheimer's, moca score of 22   INTERVAL HISTORY 12/23/2023:  Patient presents today for follow-up, he is accompanied by spouse. Last visit was a year ago, since then he has been doing ok par patient. He does not have any complaints or concerns at the moment.  Wife tells me the situation is stable, he is compliant with his medications but sometimes does not take the medication as scheduled.  He does have a pillbox but does not use it.  When asked, he states that her previous doctor has told him to take his medications at least 15 minutes apart, so he takes them one at a time throughout the day.   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  He did have a fall coming out 1 of those machine. 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  Their son also help in the farm, but he still has lot of work to do.   INTERVAL HISTORY 09/25/2022:  Devin [name] presents today for follow-up, he is accompanied by wife.  He states since last visit he has been stable.  He did have an episode of gastric bleeding ulcer which landed him in the hospital.  But he has been improving.  He is compliant with his medication Aricept .  His PCP started him on memantine  but he has not started the medicine.  Wife reported that memory is getting slightly worse, his short-term memory is getting  worse.  At times he also can get irritable.  He does not exercise as needed and his sleep is broken. No recent falls.    HISTORY OF PRESENT ILLNESS:  This is a 83 year old gentleman with past medical history of hypothyroidism, hypertension, arthritis who is presenting with his wife for memory problem.  Patient reported that he thinks that his memory is not what he used to be.  He does notice that he had some trouble remembering recent events.  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   Per wife, memory decline starting 2 to 3 years, and now getting worse.  He asked the same questions repeatedly, sometimes he will repeat himself also.  Wife also reports an incident that happened 6 weeks ago where patient was unable to get up while in church.  At that time, he also appeared confused.  Wife reported the night before, she gave him some sleeping pills which was later discontinued by his primary care doctor, she does not remember the name.  6 days after the event he did tested positive for COVID infection.   TBI:   No past history of TBI Stroke:   no past history of stroke Seizures:   no past history of seizures Sleep:    no history of sleep apnea but  reports he does not sleep well.  Mood:  patient denies anxiety and depression  Functional status: independent in all ADLs  Patient lives with spouse   Cooking: Wife  Cleaning: Wife  Shopping: Wife  Bathing: Patient  Toileting: Patient  Driving: Drive Bills: Wife  Ever left the stove on by accident?: NO  Forget how to use items around the house?: No  Getting lost going to familiar places?: No  Forgetting loved ones names?: No  Word finding difficulty? No  Sleep: Report trouble with sleeping    OTHER MEDICAL CONDITIONS: CTS, arthritis, Hypertension, hypothyroidism   REVIEW OF SYSTEMS: Full 14 system review of systems performed and negative with exception of: as noted in the HPI   ALLERGIES: No Known Allergies  HOME  MEDICATIONS: Outpatient Medications Prior to Visit  Medication Sig Dispense Refill   acetaminophen  (TYLENOL ) 325 MG tablet Take 650 mg by mouth as needed.     amLODipine (NORVASC) 5 MG tablet Take 5 mg by mouth daily.     donepezil  (ARICEPT ) 5 MG tablet Take 5 mg by mouth daily.     folic acid  (FOLVITE ) 1 MG tablet Take 1 tablet (1 mg total) by mouth daily. 90 tablet 3   levothyroxine  (SYNTHROID ) 125 MCG tablet Take 1 tablet (125 mcg total) by mouth daily before breakfast. 90 tablet 3   losartan  (COZAAR ) 100 MG tablet Take by mouth.     memantine  (NAMENDA ) 10 MG tablet Take 1 tablet (10 mg total) by mouth 2 (two) times daily. 180 tablet 3   Multiple Vitamin (MULTIVITAMIN) capsule Take 1 capsule by mouth daily.     SODIUM FLUORIDE 5000 PPM 1.1 % GEL dental gel as directed.     sulfaSALAzine  (AZULFIDINE ) 500 MG tablet Take 2 tablets (1,000 mg total) by mouth 2 (two) times daily. 360 tablet 4   traZODone (DESYREL) 50 MG tablet Take 50 mg by mouth at bedtime.     No facility-administered medications prior to visit.    PAST MEDICAL HISTORY: Past Medical History:  Diagnosis Date   Bleeding ulcer    Carpal tunnel syndrome    right hand   GERD (gastroesophageal reflux disease)    History of diverticulitis    Hypothyroidism    Left sided ulcerative colitis with rectal bleeding (HCC)    Thyroid  cancer (HCC) 1997    PAST SURGICAL HISTORY: Past Surgical History:  Procedure Laterality Date   ANTERIOR CERVICAL DECOMP/DISCECTOMY FUSION N/A 09/23/2020   Procedure: Anterior Cervical Discectomy Fusion - Cervical Three-Cervical Four;  Surgeon: Isadora Mar, MD;  Location: [facility] OR;  Service: Neurosurgery;  Laterality: N/A;  Anterior Cervical Discectomy Fusion - Cervical Three-Cervical Four   CARPAL TUNNEL RELEASE Right 09/23/2020   Procedure: Right carpal tunnel release;  Surgeon: Isadora Mar, MD;  Location: [facility] OR;  Service: Neurosurgery;  Laterality: Right;  3C   CHOLECYSTECTOMY     per  patient "maybe about 15 years ago"   COLONOSCOPY  09/06/2014   Left sided ulcerative colitis up to 55cm. Normal remaining colon. Normal TI. Mild pancolonic diverticulosis. Status post sigmoid resection with patent colo-colic anastomosis.    ESOPHAGOGASTRODUODENOSCOPY     Dr Randal Bury. Years ago per patient    FRACTURE SURGERY  1996 or 1997   right leg per patient    left hand surgery  01/2021   LOW ANTERIOR BOWEL RESECTION     MOUTH SURGERY     ROTATOR CUFF REPAIR     THYROIDECTOMY     TONSILLECTOMY  per patient "as a youngster"    FAMILY HISTORY: Family History  Problem Relation Age of Onset   Colon cancer Neg Hx    Esophageal cancer Neg Hx    Pancreatic cancer Neg Hx    Stomach cancer Neg Hx    Liver disease Neg Hx     SOCIAL HISTORY: Social History   Socioeconomic History   Marital status: Married    Spouse name: Not on file   Number of children: 2   Years of education: Not on file   Highest education level: Not on file  Occupational History   Not on file  Tobacco Use   Smoking status: Never   Smokeless tobacco: Never   Tobacco comments:    said he smoked apack in his life as a  teen  Vaping Use   Vaping status: Never Used  Substance and Sexual Activity   Alcohol use: Not Currently   Drug use: Never   Sexual activity: Not on file  Other Topics Concern   Not on file  Social History Narrative   Not on file   Social Drivers of Health   Financial Resource Strain: Low Risk  (09/05/2022)   Received from [facility], [facility]   Overall Financial Resource Strain (CARDIA)    Difficulty of Paying Living Expenses: Not hard at all  Food Insecurity: No Food Insecurity (09/05/2022)   Received from [facility], [facility]   Hunger Vital Sign    Worried About Running Out of Food in the Last Year: Never true    Ran Out of Food in the Last Year: Never true  Transportation Needs: No  Transportation Needs (09/05/2022)   Received from [facility], [facility]   [facility] - Transportation    In the past 12 months, has lack of transportation kept you from medical appointments or from getting medications?: No    Lack of Transportation (Non-Medical): No  Physical Activity: Not on file  Stress: Not on file  Social Connections: Not on file  Intimate Partner Violence: Not on file    PHYSICAL EXAM  GENERAL EXAM/CONSTITUTIONAL: Vitals:  Vitals:   12/23/23 0911 12/23/23 0923  BP: (!) 148/74 113/63  Pulse: 76   Resp: 15   SpO2: 95%   Weight: 200 lb (90.7 kg)   Height: 5' 9.5" (1.765 m)    Body mass index is 29.11 kg/m. Wt Readings from Last 3 Encounters:  12/23/23 200 lb (90.7 kg)  11/13/23 195 lb 4.8 oz (88.6 kg)  05/29/23 198 lb 4.8 oz (89.9 kg)   Patient is in no distress; well developed, nourished and groomed; neck is supple  MUSCULOSKELETAL: Gait, strength, tone, movements noted in Neurologic exam below  NEUROLOGIC: MENTAL STATUS:      No data to display         awake, alert, oriented to person, place and time normal attention and concentration language fluent, comprehension intact, naming intact fund of knowledge appropriate     12/23/2023    9:16 AM 09/25/2022   11:52 AM 10/12/2021    2:28 PM  Montreal Cognitive Assessment   Visuospatial/ Executive (0/5) 5 5 5   Naming (0/3) 2 0 1  Attention: Read list of digits (0/2) 2 1 2   Attention: Read list of letters (0/1) 1 1 1   Attention: Serial 7 subtraction starting at 100 (0/3) 3 3 3   Language: Repeat phrase (0/2) 1 2 2   Language :  Fluency (0/1) 0 0 1  Abstraction (0/2) 2 1 1   Delayed Recall (0/5) 0 0 0  Orientation (0/6) 6 6 6   Total 22 19 22   Adjusted Score (based on education)  19 22     CRANIAL NERVE:  2nd, 3rd, 4th, 6th - visual fields full to confrontation, extraocular muscles intact, no nystagmus 5th - facial sensation symmetric 7th - facial strength  symmetric 8th - hearing intact 9th - palate elevates symmetrically, uvula midline 11th - shoulder shrug symmetric 12th - tongue protrusion midline  MOTOR:  normal bulk and tone, full strength in the BUE, BLE  SENSORY:  normal and symmetric to light touch  COORDINATION:  finger-nose-finger, fine finger movements normal  GAIT/STATION:  normal  DIAGNOSTIC DATA (LABS, IMAGING, TESTING) - I reviewed patient records, labs, notes, testing and imaging myself where available.  Lab Results  Component Value Date   WBC 6.7 05/29/2023   HGB 11.7 (L) 05/29/2023   HCT 35.9 (L) 05/29/2023   MCV 88.9 05/29/2023   PLT 310 05/29/2023      Component Value Date/Time   NA 141 05/29/2023 0936   NA 143 01/24/2022 0000   K 4.0 05/29/2023 0936   CL 106 05/29/2023 0936   CO2 25 05/29/2023 0936   GLUCOSE 101 (H) 05/29/2023 0936   BUN 21 05/29/2023 0936   BUN 25 (A) 01/24/2022 0000   CREATININE 1.09 05/29/2023 0936   CALCIUM 9.2 05/29/2023 0936   PROT 6.8 05/29/2023 0936   ALBUMIN 4.0 05/29/2023 0936   AST 29 05/29/2023 0936   ALT 18 05/29/2023 0936   ALKPHOS 165 (H) 05/29/2023 0936   BILITOT 0.4 05/29/2023 0936   GFRNONAA >60 05/29/2023 0936   No results found for: "CHOL", "HDL", "LDLCALC", "LDLDIRECT", "TRIG", "CHOLHDL" Lab Results  Component Value Date   HGBA1C 6.2 01/24/2021   Lab Results  Component Value Date   VITAMINB12 509 05/29/2023   Lab Results  Component Value Date   TSH 0.800 05/29/2023    Head CT 09/19/2021 Atrophy which is most severe in the medial temporal lobe bilaterally.  Moderate chronic microvascular ischemic change in the white matter.    ASSESSMENT AND PLAN  83 y.o. year old male with hypothyroid, hypertension who is presenting for his Alzheimer disease follow up.  At last visit we increased the Aricept  to 10 mg nightly but he could not tolerate it so is back to 5 mg nightly.  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  I have a long discussion with patient, discussing the diagnosis, the progression of the disease, I am also concerned about his safety.  He is still able to manage but with heavy equipments, he is at risk of falls and any mistakes can be costly for patient.  We discussed also hiring a help but they told me this is very difficult. Their son is also helping in the farm but he just has a lot to do and he is taking in more jobs. I do agree if they do not have any additional help, then is reasonable to scale down because it will be easier to maintain but they will need a have family meetings to discuss these further.    Advised him to continue  all of his medications, continue follow-up with PCP and I will see them again in a year for follow-up or sooner if worse.    1. Mild late onset Alzheimer's dementia without behavioral disturbance, psychotic disturbance, mood disturbance, or anxiety (HCC)      Patient Instructions  Continue current medications Please continue to follow-up with PCP Please call if you do have any worsening agitation, irritability, trouble with sleep Agree with having family meeting to discuss scaling down the farm business.  Return in 1 year or sooner if worse.  No orders of the defined types were placed in this encounter.   No orders of the defined types were placed in this encounter.   Return in about 9 months (around 09/21/2024).  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 Since  he is unable to get any additional help, he should look into scaling down his business.Cassandra Cleveland, MD 12/23/2023, 12:43 PM  [facility] [address], [address] [place], [place] [address] [phone]

## 2023-12-23 NOTE — Telephone Encounter (Signed)
 Pt spouse called to reschedule app   Appt Scheduled

## 2023-12-23 NOTE — Patient Instructions (Signed)
 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  Return in 1 year or sooner if worse.

## 2024-02-19 ENCOUNTER — Telehealth: Payer: Self-pay | Admitting: Neurology

## 2024-02-19 NOTE — Telephone Encounter (Signed)
 PT wife called to request letter from MD stating  that her husband is incapable of making certain decision because  of his memory loss .  Wife will like call from MD to explain what's Pt has going  on.

## 2024-02-20 NOTE — Telephone Encounter (Signed)
 Unable to lvm 1st attempt by hf

## 2024-02-20 NOTE — Telephone Encounter (Signed)
 From last visit, his dementia is mild and he still has capacity, so I wont be able to provide the letter. If she insist, we can add patient to the cancellation list and get an updated exam and MOCA.

## 2024-02-24 NOTE — Telephone Encounter (Signed)
 Phone rep called pt back , 3 times, each time phone would ring, there would be silence no one on line, no option to leave a vm. These calls were made to pt back to back.

## 2024-02-24 NOTE — Telephone Encounter (Signed)
 Phone rep called pt's wife and relayed message from CMA.  Wife reports pt was just seen in June, please contact wife to discuss request further.

## 2024-03-04 ENCOUNTER — Other Ambulatory Visit: Payer: Self-pay | Admitting: Gastroenterology

## 2024-03-26 ENCOUNTER — Other Ambulatory Visit: Payer: Self-pay | Admitting: Gastroenterology

## 2024-05-29 ENCOUNTER — Inpatient Hospital Stay: Payer: Medicare Other

## 2024-05-29 ENCOUNTER — Other Ambulatory Visit: Payer: Self-pay | Admitting: Oncology

## 2024-05-29 ENCOUNTER — Inpatient Hospital Stay: Payer: Medicare Other | Attending: Oncology | Admitting: Oncology

## 2024-05-29 DIAGNOSIS — C73 Malignant neoplasm of thyroid gland: Secondary | ICD-10-CM

## 2024-05-29 NOTE — Progress Notes (Incomplete)
 Patient Care Team: Clemmie Nest, MD as PCP - General (Family Medicine) Henrey Sheena SQUIBB, MD as Referring Physician (Surgery) Georganna Saddie CROME, NP as Nurse Practitioner (Nurse Practitioner) Cornelius Wanda DEL, MD as Consulting Physician (Oncology)  Clinic Day:  05/29/24  Referring physician: Clemmie Nest, MD  ASSESSMENT & PLAN:   Assessment: Thyroid  cancer [facility]) Remote history of thyroid  cancer diagnosed in 1997, treated with surgery and iodine 131. He remains without evidence of recurrence.  H his dose of levothyroxine  was decreased last year from 125 mcg to 112 mcg.  I will notify him when I have the thyroid  tests from today.  Once we know the dose, I will send in refills for the year.   Anemia Anemia, worsening, but now better and back to his baseline.  This has slowly worsened over time and his hemoglobin was 12.9 back in November 2020.  Iron studies, B12 and folate, serum protein electrophoresis, and rheumatoid factor from last year were all unremarkable.  However, there was an increase in monocytes which brings up the likely possibility of early myelodysplasia. This is slightly improved today.  We will continue to monitor this.  This could also represent intermittent bleeding from his ulcerative colitis and/or polyps.  The other possibility would be his sulfasalazine   Plan   I discussed the assessment and treatment plan with the patient and he was provided an opportunity to ask questions and all were answered. The patient agreed with the plan and demonstrated an understanding of the instructions.  The patient was advised to call back if the symptoms arise in regards to her breast cancer.  I personally spent a total of *** minutes in the care of the patient today including counseling and educating.    Wanda DEL Cornelius, MD  [facility] - A DEPT OF [facility] [address] [place] [address] Dept:  [phone] Dept Fax: [phone]   No orders of the defined types were placed in this encounter.  CHIEF COMPLAINT:  CC: An 83 year old male with remote history of thyroid  cancer, and history of chronic anemia  Current Treatment:  Surveillance  INTERVAL HISTORY:  Allison is here today for repeat clinical assessment of his anemia and history of thyroid  cancer. Patient states that he feels *** and ***.        He has a WBC of ***, hemoglobin of ***, and platelet count of ***.      I will see him back in *** with ***.   He denies fever, chills, night sweats, or other signs of infection. He denies cardiorespiratory and gastrointestinal issues. He  denies pain. His appetite is *** and His weight {Weight change:10426}.  Wt Readings from Last 3 Encounters:  12/23/23 200 lb (90.7 kg)  11/13/23 195 lb 4.8 oz (88.6 kg)  05/29/23 198 lb 4.8 oz (89.9 kg)    His hemoglobin was mildly worse earlier this year and so was repeated and evaluated with no evidence of vitamin deficiency, multiple myeloma or rheumatoid arthritis.  He is on sulfasalazine  for ulcerative colitis and so I suspect it may be related to that.  Repeat hemoglobin had come up to 12 in July.  He had a mild monocytosis last year, but that has resolved.  I have recommended we simply follow it.  He has a remote history of thyroid  cancer in 1997, treated with surgery and radioactive iodine.  He is asked me to monitor his thyroid  function and prescribe  his supplement, so I have been doing this for the last several years.  Last year I had decreased his Synthroid  to 112 mcg.  After I see today's labs, I will let him know if he needs any other adjustment and then send in refills for 1 year. He denies fevers or chills. He denies pain. His appetite is good.  His weight is stable.  I have reviewed the past medical history, past surgical history, social history and family history with the patient and they are unchanged from previous  note.  ALLERGIES:  has no known allergies.  MEDICATIONS:  Current Outpatient Medications  Medication Sig Dispense Refill   acetaminophen  (TYLENOL ) 325 MG tablet Take 650 mg by mouth as needed.     amLODipine (NORVASC) 5 MG tablet Take 5 mg by mouth daily.     donepezil  (ARICEPT ) 5 MG tablet Take 5 mg by mouth daily.     folic acid  (FOLVITE ) 1 MG tablet Take 1 tablet (1 mg total) by mouth daily. Please call [phone] to schedule an office visit for more refills 90 tablet 0   levothyroxine  (SYNTHROID ) 125 MCG tablet Take 1 tablet (125 mcg total) by mouth daily before breakfast. 90 tablet 3   losartan  (COZAAR ) 100 MG tablet Take by mouth.     memantine  (NAMENDA ) 10 MG tablet Take 1 tablet (10 mg total) by mouth 2 (two) times daily. 180 tablet 3   Multiple Vitamin (MULTIVITAMIN) capsule Take 1 capsule by mouth daily.     SODIUM FLUORIDE 5000 PPM 1.1 % GEL dental gel as directed.     sulfaSALAzine  (AZULFIDINE ) 500 MG tablet Take 2 tablets (1,000 mg total) by mouth 2 (two) times daily. Please call [phone] to schedule an office visit for more refills 360 tablet 0   traZODone (DESYREL) 50 MG tablet Take 50 mg by mouth at bedtime.     No current facility-administered medications for this visit.    HISTORY OF PRESENT ILLNESS:   Oncology History   No history exists.      REVIEW OF SYSTEMS:  Review of Systems  Constitutional: Negative.   HENT: Negative.    Eyes: Negative.   Respiratory: Negative.    Cardiovascular: Negative.   Gastrointestinal: Negative.   Genitourinary: Negative.   Musculoskeletal: Negative.   Skin: Negative.   Neurological: Negative.   Endo/Heme/Allergies: Negative.   Psychiatric/Behavioral: Negative.      VITALS:  There were no vitals taken for this visit.  Wt Readings from Last 3 Encounters:  12/23/23 200 lb (90.7 kg)  11/13/23 195 lb 4.8 oz (88.6 kg)  05/29/23 198 lb 4.8 oz (89.9 kg)    There is no height or weight on file to calculate  BMI.  Performance status (ECOG): 1 - Symptomatic but completely ambulatory  PHYSICAL EXAM:  Physical Exam Constitutional:      Appearance: Normal appearance.  Eyes:     Conjunctiva/sclera: Conjunctivae normal.     Pupils: Pupils are equal, round, and reactive to light.  Cardiovascular:     Rate and Rhythm: Normal rate and regular rhythm.     Heart sounds: No murmur heard. Pulmonary:     Effort: Pulmonary effort is normal.     Breath sounds: Normal breath sounds.  Abdominal:     General: Bowel sounds are normal.  Musculoskeletal:     Cervical back: Normal range of motion and neck supple. No tenderness.     Right lower leg: No edema.     Left lower leg: No  edema.  Lymphadenopathy:     Cervical: No cervical adenopathy.     Upper Body:     Right upper body: No supraclavicular adenopathy.     Left upper body: No supraclavicular adenopathy.  Neurological:     Mental Status: He is alert and oriented to person, place, and time.  Psychiatric:        Mood and Affect: Mood normal.     LABORATORY DATA:  I have reviewed the data as listed    Component Value Date/Time   NA 141 05/29/2023 0936   NA 143 01/24/2022 0000   K 4.0 05/29/2023 0936   CL 106 05/29/2023 0936   CO2 25 05/29/2023 0936   GLUCOSE 101 (H) 05/29/2023 0936   BUN 21 05/29/2023 0936   BUN 25 (A) 01/24/2022 0000   CREATININE 1.09 05/29/2023 0936   CALCIUM 9.2 05/29/2023 0936   PROT 6.8 05/29/2023 0936   ALBUMIN 4.0 05/29/2023 0936   AST 29 05/29/2023 0936   ALT 18 05/29/2023 0936   ALKPHOS 165 (H) 05/29/2023 0936   BILITOT 0.4 05/29/2023 0936   GFRNONAA >60 05/29/2023 0936    No results found for: SPEP, UPEP  Lab Results  Component Value Date   WBC 6.7 05/29/2023   NEUTROABS 4.0 05/29/2023   HGB 11.7 (L) 05/29/2023   HCT 35.9 (L) 05/29/2023   MCV 88.9 05/29/2023   PLT 310 05/29/2023      Chemistry      Component Value Date/Time   NA 141 05/29/2023 0936   NA 143 01/24/2022 0000   K 4.0  05/29/2023 0936   CL 106 05/29/2023 0936   CO2 25 05/29/2023 0936   BUN 21 05/29/2023 0936   BUN 25 (A) 01/24/2022 0000   CREATININE 1.09 05/29/2023 0936   GLU 107 01/24/2022 0000      Component Value Date/Time   CALCIUM 9.2 05/29/2023 0936   ALKPHOS 165 (H) 05/29/2023 0936   AST 29 05/29/2023 0936   ALT 18 05/29/2023 0936   BILITOT 0.4 05/29/2023 0936       RADIOGRAPHIC STUDIES: I have personally reviewed the radiological images as listed and agreed with the findings in the report. No results found.  I,Yazmyn M Lowe,acting as a neurosurgeon for Wanda VEAR Cornish, MD.,have documented all relevant documentation on the behalf of Wanda VEAR Cornish, MD,as directed by  Wanda VEAR Cornish, MD while in the presence of Wanda VEAR Cornish, MD.  I have reviewed this report as typed by the medical scribe, and it is complete and accurate.

## 2024-06-23 ENCOUNTER — Other Ambulatory Visit: Payer: Self-pay | Admitting: Hematology and Oncology

## 2024-06-23 ENCOUNTER — Other Ambulatory Visit: Payer: Self-pay | Admitting: Gastroenterology

## 2024-06-23 DIAGNOSIS — C73 Malignant neoplasm of thyroid gland: Secondary | ICD-10-CM

## 2024-06-25 ENCOUNTER — Other Ambulatory Visit: Payer: Self-pay | Admitting: Hematology and Oncology

## 2024-06-25 DIAGNOSIS — C73 Malignant neoplasm of thyroid gland: Secondary | ICD-10-CM

## 2024-07-03 ENCOUNTER — Encounter: Payer: Self-pay | Admitting: Oncology

## 2024-07-03 ENCOUNTER — Telehealth: Payer: Self-pay | Admitting: Oncology

## 2024-07-03 ENCOUNTER — Inpatient Hospital Stay: Admitting: Oncology

## 2024-07-03 ENCOUNTER — Inpatient Hospital Stay: Attending: Oncology

## 2024-07-03 ENCOUNTER — Other Ambulatory Visit: Payer: Self-pay | Admitting: Oncology

## 2024-07-03 VITALS — BP 158/80 | HR 62 | Temp 98.2°F | Resp 18 | Ht 69.5 in | Wt 203.5 lb

## 2024-07-03 DIAGNOSIS — Z85850 Personal history of malignant neoplasm of thyroid: Secondary | ICD-10-CM | POA: Diagnosis present

## 2024-07-03 DIAGNOSIS — D649 Anemia, unspecified: Secondary | ICD-10-CM | POA: Insufficient documentation

## 2024-07-03 DIAGNOSIS — Z79899 Other long term (current) drug therapy: Secondary | ICD-10-CM | POA: Diagnosis not present

## 2024-07-03 DIAGNOSIS — C73 Malignant neoplasm of thyroid gland: Secondary | ICD-10-CM

## 2024-07-03 DIAGNOSIS — R748 Abnormal levels of other serum enzymes: Secondary | ICD-10-CM | POA: Diagnosis not present

## 2024-07-03 LAB — CBC WITH DIFFERENTIAL (CANCER CENTER ONLY)
Abs Immature Granulocytes: 0.03 K/uL (ref 0.00–0.07)
Basophils Absolute: 0 K/uL (ref 0.0–0.1)
Basophils Relative: 1 %
Eosinophils Absolute: 0.4 K/uL (ref 0.0–0.5)
Eosinophils Relative: 6 %
HCT: 36.8 % — ABNORMAL LOW (ref 39.0–52.0)
Hemoglobin: 11.8 g/dL — ABNORMAL LOW (ref 13.0–17.0)
Immature Granulocytes: 0 %
Lymphocytes Relative: 21 %
Lymphs Abs: 1.6 K/uL (ref 0.7–4.0)
MCH: 29.1 pg (ref 26.0–34.0)
MCHC: 32.1 g/dL (ref 30.0–36.0)
MCV: 90.9 fL (ref 80.0–100.0)
Monocytes Absolute: 0.9 K/uL (ref 0.1–1.0)
Monocytes Relative: 12 %
Neutro Abs: 4.6 K/uL (ref 1.7–7.7)
Neutrophils Relative %: 60 %
Platelet Count: 310 K/uL (ref 150–400)
RBC: 4.05 MIL/uL — ABNORMAL LOW (ref 4.22–5.81)
RDW: 14.4 % (ref 11.5–15.5)
WBC Count: 7.6 K/uL (ref 4.0–10.5)
nRBC: 0 % (ref 0.0–0.2)

## 2024-07-03 LAB — CMP (CANCER CENTER ONLY)
ALT: 26 U/L (ref 0–44)
AST: 34 U/L (ref 15–41)
Albumin: 4.1 g/dL (ref 3.5–5.0)
Alkaline Phosphatase: 150 U/L — ABNORMAL HIGH (ref 38–126)
Anion gap: 7 (ref 5–15)
BUN: 22 mg/dL (ref 8–23)
CO2: 27 mmol/L (ref 22–32)
Calcium: 9.3 mg/dL (ref 8.9–10.3)
Chloride: 105 mmol/L (ref 98–111)
Creatinine: 1.01 mg/dL (ref 0.61–1.24)
GFR, Estimated: >60 mL/min
Glucose, Bld: 101 mg/dL — ABNORMAL HIGH (ref 70–99)
Potassium: 4.2 mmol/L (ref 3.5–5.1)
Sodium: 139 mmol/L (ref 135–145)
Total Bilirubin: 0.4 mg/dL (ref 0.0–1.2)
Total Protein: 6.7 g/dL (ref 6.5–8.1)

## 2024-07-03 LAB — TSH: TSH: 3.92 u[IU]/mL (ref 0.350–4.500)

## 2024-07-03 NOTE — Progress Notes (Addendum)
 " [facility]  [address] [place], [place] [address] [phone]  Clinic Day:  07/03/2024  Referring physician: Clemmie Nest, MD  CHIEF COMPLAINT:  CC: Remote history of thyroid  cancer  Current Treatment: Observation  HISTORY OF PRESENT ILLNESS:  Devin [name] is a 83 y.o. male with a history of thyroid  cancer diagnosed in 52.  He presented with fever and a palpable mass.  He underwent total thyroidectomy and right radical neck dissection, followed by radioactive iodine at [facility]. He transferred his care here in August 2015 and has remained without evidence of disease.  Thyroid  ultrasound in July 2016 revealed normal post thyroidectomy appearance, with no evidence of recurrence.  Chest x-ray in June 2016 was normal.  He had a mammogram in September 2015 to evaluate gynecomastia and that was negative.    He has ulcerative colitis, for which he is on sulfasalazine .  Colonoscopy was done in February 2016 and revealed left sided ulcerative colitis, as well as diverticulosis, but was otherwise normal.  He underwent cholecystectomy in February 2018.  He had a colonoscopy with Dr. Charlanne in February 2020 with resection of a tubular adenoma.  It was recommended to repeat this in 3 years.  He declined repeat colonoscopy.   He has had mild chronic mild anemia since 2015, which has slowly worsened over the years.  His hemoglobin dropped to 10.5 in March 2023  evaluation of the anemia did not reveal a specific etiology.  There was no evidence of nutritional deficiency, monoclonal gammopathy or hemolysis.  Hemoglobin improved to 11.8 in July 2023, which had been baseline since 2022.  The dose of levothyroxine  was decreased to 112 mcg daily July 2022, when his TSH was suppressed more than recommended at 0.166.  Levothyroxine  was increased back to 125 mcg daily in November 2023 when the TSH was 2.517.  INTERVAL HISTORY:  Devin [name] is here today for for annual follow-up of his past  thyroid  cancer. He has chronic mild anemia, but last year his hemoglobin dropped to 8.2. His wife tells me it went even lower at 7 when he got to [facility] and was eventually transferred to [facility], where he was found to have a bleeding ulcer. Patient states that he feels well and complains of ankle pain in the morning. He denies progressive fatigue, appetite changes, recurrent melena, or other form of overt blood loss concerning for recurrent severe anemia. He is taking Geritol daily. He does have worsening memory impairment, and is now on Aricept  and Namenda .  He has a WBC of 7.6, a low hemoglobin of 11.8 up from 11.7, and platelet count of 310,000. His CMP is normal other than an elevated alkaline phosphatase of 150, improved from 165. His TSH is pending. I review the results of his TSH and adjust his levothyroxine  dosage as appropriate. I will see him back in 1 year with CBC, CMP, and TSH.  He denies fever, chills, night sweats, or other signs of infection. He denies cardiorespiratory and gastrointestinal issues.  His appetite is good and His weight has increased 4 pounds over last 1 year. He is accompanied by his wife.  REVIEW OF SYSTEMS:  Review of Systems  Constitutional:  Positive for fatigue. Negative for appetite change, chills, diaphoresis, fever and unexpected weight change.  HENT:   Negative for lump/mass, mouth sores, nosebleeds, sore throat and trouble swallowing.   Eyes:  Negative for eye problems.  Respiratory:  Negative for cough, hemoptysis and shortness of breath.  Cardiovascular:  Negative for chest pain and leg swelling.  Gastrointestinal:  Negative for abdominal pain, blood in stool, constipation, diarrhea, nausea and vomiting.  Genitourinary:  Negative for difficulty urinating, dysuria, frequency and hematuria.   Musculoskeletal:  Positive for arthralgias (ankle pain in the morning). Negative for back pain and myalgias.  Skin:  Negative for itching and rash.   Neurological:  Negative for dizziness, extremity weakness, headaches, light-headedness and numbness.  Hematological:  Negative for adenopathy. Does not bruise/bleed easily.  Psychiatric/Behavioral:  Negative for depression and sleep disturbance. The patient is not nervous/anxious.        Memory impairment    VITALS:  Blood pressure (!) 158/80, pulse 62, temperature 98.2 F (36.8 C), temperature source Oral, resp. rate 18, height 5' 9.5 (1.765 m), weight 203 lb 8 oz (92.3 kg), SpO2 98%.  Wt Readings from Last 3 Encounters:  07/03/24 203 lb 8 oz (92.3 kg)  12/23/23 200 lb (90.7 kg)  11/13/23 195 lb 4.8 oz (88.6 kg)    Body mass index is 29.62 kg/m.  Performance status (ECOG): 1 - Symptomatic but completely ambulatory  PHYSICAL EXAM:  Physical Exam Vitals and nursing note reviewed. Exam conducted with a chaperone present.  Constitutional:      General: He is not in acute distress.    Appearance: Normal appearance. He is normal weight.  HENT:     Head: Normocephalic and atraumatic.     Mouth/Throat:     Mouth: Mucous membranes are moist.     Pharynx: Oropharynx is clear. No oropharyngeal exudate or posterior oropharyngeal erythema.  Eyes:     General: No scleral icterus.    Extraocular Movements: Extraocular movements intact.     Conjunctiva/sclera: Conjunctivae normal.     Pupils: Pupils are equal, round, and reactive to light.  Neck:     Comments: Well-healed scar on the anterior neck Cardiovascular:     Rate and Rhythm: Normal rate and regular rhythm.     Heart sounds: Normal heart sounds. No murmur heard.    No friction rub. No gallop.  Pulmonary:     Effort: Pulmonary effort is normal.     Breath sounds: Normal breath sounds. No wheezing, rhonchi or rales.  Abdominal:     General: Bowel sounds are normal. There is no distension.     Palpations: Abdomen is soft. There is no hepatomegaly, splenomegaly or mass.     Tenderness: There is no abdominal tenderness.   Musculoskeletal:        General: Normal range of motion.     Cervical back: Normal range of motion and neck supple. No tenderness.     Right lower leg: No edema.     Left lower leg: No edema.  Lymphadenopathy:     Cervical: No cervical adenopathy.     Upper Body:     Right upper body: No supraclavicular or axillary adenopathy.     Left upper body: No supraclavicular or axillary adenopathy.     Lower Body: No right inguinal adenopathy. No left inguinal adenopathy.  Skin:    General: Skin is warm and dry.     Coloration: Skin is not jaundiced.     Findings: No rash.  Neurological:     Mental Status: He is alert and oriented to person, place, and time.     Cranial Nerves: No cranial nerve deficit.  Psychiatric:        Mood and Affect: Mood normal.        Behavior: Behavior  normal.        Thought Content: Thought content normal.    LABS:      Latest Ref Rng & Units 07/03/2024   10:26 AM 05/29/2023    9:36 AM 02/25/2023   11:03 AM  CBC  WBC 4.0 - 10.5 K/uL 7.6  6.7  8.5   Hemoglobin 13.0 - 17.0 g/dL 88.1  88.2  88.1   Hematocrit 39.0 - 52.0 % 36.8  35.9  36.0   Platelets 150 - 400 K/uL 310  310  324.0       Latest Ref Rng & Units 07/03/2024   10:26 AM 05/29/2023    9:36 AM 02/25/2023   11:03 AM  CMP  Glucose 70 - 99 mg/dL 898  898  896   BUN 8 - 23 mg/dL 22  21  20    Creatinine 0.61 - 1.24 mg/dL 8.98  8.90  8.91   Sodium 135 - 145 mmol/L 139  141  136   Potassium 3.5 - 5.1 mmol/L 4.2  4.0  4.1   Chloride 98 - 111 mmol/L 105  106  106   CO2 22 - 32 mmol/L 27  25  24    Calcium 8.9 - 10.3 mg/dL 9.3  9.2  9.0   Total Protein 6.5 - 8.1 g/dL 6.7  6.8  6.8   Total Bilirubin 0.0 - 1.2 mg/dL 0.4  0.4  0.3   Alkaline Phos 38 - 126 U/L 150  165  130   AST 15 - 41 U/L 34  29  24   ALT 0 - 44 U/L 26  18  17      Lab Results  Component Value Date   TOTALPROTELP 6.5 09/27/2021   ALBUMINELP 3.5 09/27/2021   A1GS 0.2 09/27/2021   A2GS 0.8 09/27/2021   BETS 0.8 09/27/2021    GAMS 1.3 09/27/2021   MSPIKE Not Observed 09/27/2021   SPEI Comment 09/27/2021   Lab Results  Component Value Date   TIBC 312 05/29/2023   TIBC 330.4 02/25/2023   TIBC 298 09/27/2021   FERRITIN 38 05/29/2023   FERRITIN 23.7 02/25/2023   FERRITIN 61 09/27/2021   IRONPCTSAT 26 05/29/2023   IRONPCTSAT 14.8 (L) 02/25/2023   IRONPCTSAT 18 09/27/2021    STUDIES:  No results found.    HISTORY:   Past Medical History:  Diagnosis Date   Bleeding ulcer    Carpal tunnel syndrome    right hand   GERD (gastroesophageal reflux disease)    History of diverticulitis    Hypothyroidism    Left sided ulcerative colitis with rectal bleeding (HCC)    Thyroid  cancer (HCC) 1997    Past Surgical History:  Procedure Laterality Date   ANTERIOR CERVICAL DECOMP/DISCECTOMY FUSION N/A 09/23/2020   Procedure: Anterior Cervical Discectomy Fusion - Cervical Three-Cervical Four;  Surgeon: Joshua Alm RAMAN, MD;  Location: [facility] OR;  Service: Neurosurgery;  Laterality: N/A;  Anterior Cervical Discectomy Fusion - Cervical Three-Cervical Four   CARPAL TUNNEL RELEASE Right 09/23/2020   Procedure: Right carpal tunnel release;  Surgeon: Joshua Alm RAMAN, MD;  Location: [facility] OR;  Service: Neurosurgery;  Laterality: Right;  3C   CHOLECYSTECTOMY     per patient maybe about 15 years ago   COLONOSCOPY  09/06/2014   Left sided ulcerative colitis up to 55cm. Normal remaining colon. Normal TI. Mild pancolonic diverticulosis. Status post sigmoid resection with patent colo-colic anastomosis.    ESOPHAGOGASTRODUODENOSCOPY     Dr Towana. Years ago per patient  FRACTURE SURGERY  1996 or 1997   right leg per patient    left hand surgery  01/2021   LOW ANTERIOR BOWEL RESECTION     MOUTH SURGERY     ROTATOR CUFF REPAIR     THYROIDECTOMY     TONSILLECTOMY     per patient as a youngster    Family History  Problem Relation Age of Onset   Colon cancer Neg Hx    Esophageal cancer Neg Hx    Pancreatic cancer Neg Hx     Stomach cancer Neg Hx    Liver disease Neg Hx     Social History:  reports that he has never smoked. He has never used smokeless tobacco. He reports that he does not currently use alcohol. He reports that he does not use drugs.The patient is accompanied by his wife today.  Allergies: No Known Allergies  Current Medications: Current Outpatient Medications  Medication Sig Dispense Refill   acetaminophen  (TYLENOL ) 325 MG tablet Take 650 mg by mouth as needed.     amLODipine (NORVASC) 5 MG tablet Take 5 mg by mouth daily.     donepezil  (ARICEPT ) 5 MG tablet Take 5 mg by mouth daily.     folic acid  (FOLVITE ) 1 MG tablet Take 1 tablet (1 mg total) by mouth daily. Please call [phone] to schedule an office visit for more refills 90 tablet 0   losartan  (COZAAR ) 100 MG tablet Take by mouth.     memantine  (NAMENDA ) 10 MG tablet Take 1 tablet (10 mg total) by mouth 2 (two) times daily. 180 tablet 3   Multiple Vitamin (MULTIVITAMIN) capsule Take 1 capsule by mouth daily.     SODIUM FLUORIDE 5000 PPM 1.1 % GEL dental gel as directed.     sulfaSALAzine  (AZULFIDINE ) 500 MG tablet Take 2 tablets (1,000 mg total) by mouth 2 (two) times daily. Please call [phone] to schedule an office visit for more refills 360 tablet 0   traZODone (DESYREL) 50 MG tablet Take 50 mg by mouth at bedtime.     No current facility-administered medications for this visit.   ASSESSMENT & PLAN:  Assessment: 1.  History of papillary thyroid  cancer diagnosed in 1997, treated with surgery and iodine 131.  He remains without evidence of recurrence.  His TSH was 0.8 last year. Today's results are pending and I will call and adjust his thyroid  supplement accordingly.    2.  Chronic anemia with recent upper GI bleed causing severe anemia.  His hemoglobin is back to baseline at 11.8. I will have him continue Geritol.  3.  Recent upper GI bleed likely secondary to duodenal ulcer on meloxicam.  He denies any NSAID use.  He  continues pantoprazole  twice daily.  4.  He does have worsening memory impairment, and is now on Aricept  and Namenda .  Plan:  He feels well and complains of ankle pain in the morning.He does have worsening memory impairment. He denies progressive fatigue, appetite changes, recurrent melena, or other form of overt blood loss concerning for recurrent severe anemia. He is taking Geritol daily.  He has a WBC of 7.6, a low hemoglobin of 11.8 up from 11.7, and platelet count of 310,000. His CMP is normal other than an elevated alkaline phosphatase of 150, improved from 165. His TSH is pending. I review the results of his TSH and adjust his levothyroxine  dosage as appropriate. I will see him back in 1 year with CBC, CMP, and TSH. The patient and his wife  understand the plans discussed today and are in agreement with them.  He knows to contact our office if he develops concerns prior to his next appointment.    I provided 13 minutes of face-to-face time during this encounter and > 50% was spent counseling as documented under my assessment and plan.   Wanda VEAR Cornish, MD  [facility] - A DEPT OF [facility] [address] [place] [address] Dept: [phone] Dept Fax: [phone]   No orders of the defined types were placed in this encounter.   LILLETTE Aretta Cook, acting as a scribe for Wanda VEAR Cornish, MD, have documented all relevant documentation on the behalf of Wanda VEAR Cornish, MD, as directed by Wanda VEAR Cornish, MD, while in the presence of Wanda VEAR Cornish, MD.  I have reviewed this report as typed by the medical scribe, and it is complete and accurate.  "

## 2024-07-03 NOTE — Telephone Encounter (Signed)
 Patient has been scheduled for follow-up visit per 07/03/2024 LOS.  Pt given an appt calendar with date and time.

## 2024-07-16 ENCOUNTER — Other Ambulatory Visit: Payer: Self-pay | Admitting: Oncology

## 2024-07-16 DIAGNOSIS — E039 Hypothyroidism, unspecified: Secondary | ICD-10-CM

## 2024-07-16 MED ORDER — LEVOTHYROXINE SODIUM 125 MCG PO TABS: 125.0000 ug | ORAL_TABLET | Freq: Every day | ORAL | 3 refills | Status: AC

## 2024-07-20 ENCOUNTER — Telehealth: Payer: Self-pay

## 2024-07-20 NOTE — Telephone Encounter (Signed)
-----   Message from Wanda Cornish, MD sent at 07/16/2024 12:10 PM EST ----- Regarding: call Tell wife thyroid  is normal so I sent refill in to [facility] (or [facility]?)

## 2024-07-20 NOTE — Telephone Encounter (Signed)
 Attempted to contact patient. No answer.

## 2024-08-04 ENCOUNTER — Telehealth: Payer: Self-pay | Admitting: Neurology

## 2024-08-04 NOTE — Telephone Encounter (Signed)
 Pt wife called stating that Pt is getting medication through  Primary appt is not needed at time   Appt Canceled

## 2024-08-18 ENCOUNTER — Other Ambulatory Visit: Payer: Self-pay

## 2024-08-18 ENCOUNTER — Emergency Department (HOSPITAL_COMMUNITY)

## 2024-08-18 ENCOUNTER — Encounter (HOSPITAL_COMMUNITY): Payer: Self-pay

## 2024-08-18 ENCOUNTER — Emergency Department (HOSPITAL_COMMUNITY)
Admission: EM | Admit: 2024-08-18 | Discharge: 2024-08-18 | Disposition: A | Discharge status: Home / Self Care | Attending: Emergency Medicine | Admitting: Emergency Medicine

## 2024-08-18 DIAGNOSIS — Y9301 Activity, walking, marching and hiking: Secondary | ICD-10-CM | POA: Insufficient documentation

## 2024-08-18 DIAGNOSIS — Z79899 Other long term (current) drug therapy: Secondary | ICD-10-CM | POA: Insufficient documentation

## 2024-08-18 DIAGNOSIS — W009XXA Unspecified fall due to ice and snow, initial encounter: Secondary | ICD-10-CM | POA: Insufficient documentation

## 2024-08-18 DIAGNOSIS — E039 Hypothyroidism, unspecified: Secondary | ICD-10-CM | POA: Insufficient documentation

## 2024-08-18 DIAGNOSIS — S0101XA Laceration without foreign body of scalp, initial encounter: Secondary | ICD-10-CM | POA: Insufficient documentation

## 2024-08-18 MED ORDER — LIDOCAINE-EPINEPHRINE (PF) 2 %-1:200000 IJ SOLN
10.0000 mL | Freq: Once | INTRAMUSCULAR | Status: AC
  Administered 2024-08-18: 10 mL
  Filled 2024-08-18: qty 20

## 2024-08-18 NOTE — ED Triage Notes (Signed)
 Pt was BIB [facility] from home with complaints of slipping on the ice. He split his head open down to the skull and occipital region roughly 4 in. No LOC   220/106 BP 70 HR 16 RR 95% RA

## 2024-08-18 NOTE — ED Notes (Signed)
 Lidocaine  at bedside.

## 2024-08-18 NOTE — Discharge Instructions (Signed)
 You have 10 staples that will need to be removed.  Thankfully your CAT scans looked okay with no sign of broken bones or internal bleeding.  You can take Tylenol  as needed for pain over the next few days.  However if you develop severe headaches, repetitive vomiting, confusion, difficulty walking you should return to the emergency room for repeat scan.  Avoid any heavy lifting, climbing up ladders or any activity that could cause you to slip and fall and hit your head again for the next 7 to 10 days.  It is okay to take a shower and the staples can get wet but do not soak them underwater and do not scrub really hard over them.  Otherwise they can be open to the air but you may want a put a pad on your pillowcase tonight.

## 2024-09-17 ENCOUNTER — Ambulatory Visit: Admitting: Neurology

## 2024-09-21 ENCOUNTER — Ambulatory Visit: Admitting: Neurology

## 2025-06-25 ENCOUNTER — Inpatient Hospital Stay

## 2025-06-25 ENCOUNTER — Inpatient Hospital Stay: Admitting: Oncology
